# Patient Record
Sex: Male | Born: 1965 | Race: Black or African American | Hispanic: No | Marital: Married | Smoking: Current some day smoker
Health system: Southern US, Community
[De-identification: ages and names within clinical notes are randomized; demographics above are authoritative.]

## PROBLEM LIST (undated history)

## (undated) ENCOUNTER — Emergency Department (HOSPITAL_COMMUNITY): Admission: EM | Payer: Non-veteran care | Source: Home / Self Care

## (undated) DIAGNOSIS — D497 Neoplasm of unspecified behavior of endocrine glands and other parts of nervous system: Secondary | ICD-10-CM

## (undated) DIAGNOSIS — G4730 Sleep apnea, unspecified: Secondary | ICD-10-CM

## (undated) DIAGNOSIS — E039 Hypothyroidism, unspecified: Secondary | ICD-10-CM

## (undated) DIAGNOSIS — Z720 Tobacco use: Secondary | ICD-10-CM

## (undated) DIAGNOSIS — I10 Essential (primary) hypertension: Secondary | ICD-10-CM

## (undated) DIAGNOSIS — E785 Hyperlipidemia, unspecified: Secondary | ICD-10-CM

## (undated) HISTORY — DX: Sleep apnea, unspecified: G47.30

## (undated) HISTORY — DX: Neoplasm of unspecified behavior of endocrine glands and other parts of nervous system: D49.7

## (undated) HISTORY — PX: TONSILLECTOMY AND ADENOIDECTOMY: SUR1326

---

## 1997-12-18 ENCOUNTER — Emergency Department (HOSPITAL_COMMUNITY): Admission: EM | Admit: 1997-12-18 | Discharge: 1997-12-18 | Payer: Self-pay | Admitting: Internal Medicine

## 2006-08-26 ENCOUNTER — Ambulatory Visit (HOSPITAL_COMMUNITY): Admission: RE | Admit: 2006-08-26 | Discharge: 2006-08-26 | Payer: Self-pay | Admitting: Interventional Cardiology

## 2007-05-13 DIAGNOSIS — G4730 Sleep apnea, unspecified: Secondary | ICD-10-CM

## 2007-05-13 HISTORY — DX: Sleep apnea, unspecified: G47.30

## 2007-10-01 ENCOUNTER — Encounter: Payer: Self-pay | Admitting: Endocrinology

## 2007-10-25 ENCOUNTER — Encounter: Payer: Self-pay | Admitting: Endocrinology

## 2007-10-25 ENCOUNTER — Telehealth (INDEPENDENT_AMBULATORY_CARE_PROVIDER_SITE_OTHER): Payer: Self-pay | Admitting: *Deleted

## 2008-03-22 ENCOUNTER — Emergency Department (HOSPITAL_COMMUNITY): Admission: EM | Admit: 2008-03-22 | Discharge: 2008-03-22 | Payer: Self-pay | Admitting: Emergency Medicine

## 2008-05-12 DIAGNOSIS — D497 Neoplasm of unspecified behavior of endocrine glands and other parts of nervous system: Secondary | ICD-10-CM

## 2008-05-12 HISTORY — DX: Neoplasm of unspecified behavior of endocrine glands and other parts of nervous system: D49.7

## 2008-05-12 HISTORY — PX: THYROIDECTOMY: SHX17

## 2011-03-05 ENCOUNTER — Inpatient Hospital Stay (INDEPENDENT_AMBULATORY_CARE_PROVIDER_SITE_OTHER)
Admission: RE | Admit: 2011-03-05 | Discharge: 2011-03-05 | Disposition: A | Discharge status: Home / Self Care | Payer: Self-pay | Source: Ambulatory Visit | Attending: Family Medicine | Admitting: Family Medicine

## 2011-03-05 DIAGNOSIS — E039 Hypothyroidism, unspecified: Secondary | ICD-10-CM

## 2011-03-05 DIAGNOSIS — Z760 Encounter for issue of repeat prescription: Secondary | ICD-10-CM

## 2011-05-12 ENCOUNTER — Telehealth (HOSPITAL_COMMUNITY): Payer: Self-pay | Admitting: *Deleted

## 2011-05-15 ENCOUNTER — Emergency Department (INDEPENDENT_AMBULATORY_CARE_PROVIDER_SITE_OTHER)
Admission: EM | Admit: 2011-05-15 | Discharge: 2011-05-15 | Disposition: A | Discharge status: Home / Self Care | Payer: Medicaid Other | Source: Home / Self Care | Attending: Family Medicine | Admitting: Family Medicine

## 2011-05-15 DIAGNOSIS — E039 Hypothyroidism, unspecified: Secondary | ICD-10-CM

## 2011-05-15 DIAGNOSIS — M545 Low back pain: Secondary | ICD-10-CM

## 2011-05-15 MED ORDER — CYCLOBENZAPRINE HCL 10 MG PO TABS: 10.0000 mg | ORAL_TABLET | Freq: Two times a day (BID) | ORAL | Status: AC | PRN

## 2011-05-15 MED ORDER — IBUPROFEN 600 MG PO TABS: 600.0000 mg | ORAL_TABLET | Freq: Three times a day (TID) | ORAL | Status: AC | PRN

## 2011-05-15 MED ORDER — LEVOTHYROXINE SODIUM 150 MCG PO TABS: 150.0000 ug | ORAL_TABLET | Freq: Every day | ORAL | Status: DC

## 2011-05-15 NOTE — ED Notes (Signed)
Pt needs refill on thyroid medication- has been out since Monday.  States he was trying to keep something from falling at work on Monday and later that evening his lower back started aching.  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

## 2011-05-19 NOTE — ED Provider Notes (Signed)
History     CSN: [id number]  Arrival date & time 05/15/11  1750   First MD Initiated Contact with Patient 05/15/11 1821      Chief Complaint  Patient presents with  . Back Pain  . Medication Refill    (Consider location/radiation/quality/duration/timing/severity/associated sxs/prior treatment) HPI Comments: 46 y/o male h/o hypothyroidism here requesting refills for his thyroid medication as he ran out 3 days ago. He is establishing care with a primary care provider as he ws recently approved for medicaid and states his first appointment is not until next month. States he was started on synthroid while he was in jail and his TSH was monitored and has been stable on 150 mcg daily for a long time.  Also started with low back pain and muscle aches when he ran out of synthroid and also after trying to catch a box that was falling at work. He had refills before from ED providers. Denies fever or chills, denies hematuria or dysuria.   History reviewed. No pertinent past medical history.  Past Surgical History  Procedure Date  . Thyroidectomy     No family history on file.  History  Substance Use Topics  . Smoking status: Current Some Day Smoker -- 0.5 packs/day    Types: Cigarettes  . Smokeless tobacco: Not on file  . Alcohol Use: No      Review of Systems  Constitutional: Negative for fever, chills and fatigue.  Respiratory: Negative for cough, chest tightness and shortness of breath.   Cardiovascular: Negative for chest pain and leg swelling.  Gastrointestinal: Negative for nausea, vomiting and abdominal pain.  Musculoskeletal: Positive for back pain.  Neurological: Negative for dizziness and headaches.    Allergies  Review of patient's allergies indicates no known allergies.  Home Medications   Current Outpatient Rx  Name Route Sig Dispense Refill  . LEVOTHYROXINE SODIUM 150 MCG PO TABS Oral Take 150 mcg by mouth daily.      . CYCLOBENZAPRINE HCL 10 MG PO TABS Oral  Take 1 tablet (10 mg total) by mouth 2 (two) times daily as needed for muscle spasms. 20 tablet 0  . IBUPROFEN 600 MG PO TABS Oral Take 1 tablet (600 mg total) by mouth every 8 (eight) hours as needed for pain. 20 tablet 0    Take with food  . LEVOTHYROXINE SODIUM 150 MCG PO TABS Oral Take 1 tablet (150 mcg total) by mouth daily. 30 tablet 1    BP 129/64  Pulse 58  Temp(Src) 98 F (36.7 C) (Oral)  Resp 16  SpO2 97%  Physical Exam  Nursing note and vitals reviewed. Constitutional: He is oriented to person, place, and time. He appears well-developed and well-nourished. No distress.  HENT:  Head: Normocephalic.  Eyes: EOM are normal. Pupils are equal, round, and reactive to light.       No mixedema  Neck: Neck supple. No thyromegaly present.  Cardiovascular: Normal rate, regular rhythm, normal heart sounds and intact distal pulses.  Exam reveals no gallop and no friction rub.   No murmur heard.      Mild bradycardic  Pulmonary/Chest: Breath sounds normal.  Abdominal: Soft. There is no tenderness.  Musculoskeletal:       Lumbar back: He exhibits decreased range of motion and tenderness. He exhibits no bony tenderness, no swelling, no edema and no deformity.       Back:  Lymphadenopathy:    He has no cervical adenopathy.  Neurological: He is alert and oriented  to person, place, and time.  Skin: No rash noted.    ED Course  Procedures (including critical care time)  Labs Reviewed - No data to display No results found.   1. Hypothyroidism   2. Low back pain       MDM  Refilled synthroid, likely muscle aches related to hypothyroidism. Reccommended follow up in next 2-4 weeks. Red flags for return discussed.        Sharin Grave, MD 05/19/11 1059

## 2011-06-16 ENCOUNTER — Ambulatory Visit (INDEPENDENT_AMBULATORY_CARE_PROVIDER_SITE_OTHER): Payer: Medicaid Other | Admitting: Family Medicine

## 2011-06-16 ENCOUNTER — Encounter: Payer: Self-pay | Admitting: Family Medicine

## 2011-06-16 VITALS — BP 110/62 | HR 82 | Temp 98.0°F | Ht 73.0 in | Wt 291.0 lb

## 2011-06-16 DIAGNOSIS — E038 Other specified hypothyroidism: Secondary | ICD-10-CM

## 2011-06-16 DIAGNOSIS — E785 Hyperlipidemia, unspecified: Secondary | ICD-10-CM

## 2011-06-16 DIAGNOSIS — S39012A Strain of muscle, fascia and tendon of lower back, initial encounter: Secondary | ICD-10-CM

## 2011-06-16 DIAGNOSIS — E039 Hypothyroidism, unspecified: Secondary | ICD-10-CM

## 2011-06-16 DIAGNOSIS — E7800 Pure hypercholesterolemia, unspecified: Secondary | ICD-10-CM

## 2011-06-16 DIAGNOSIS — E669 Obesity, unspecified: Secondary | ICD-10-CM

## 2011-06-16 DIAGNOSIS — F17200 Nicotine dependence, unspecified, uncomplicated: Secondary | ICD-10-CM

## 2011-06-16 DIAGNOSIS — Z720 Tobacco use: Secondary | ICD-10-CM

## 2011-06-16 DIAGNOSIS — Z23 Encounter for immunization: Secondary | ICD-10-CM

## 2011-06-16 LAB — LDL CHOLESTEROL, DIRECT: Direct LDL: 179 mg/dL — ABNORMAL HIGH

## 2011-06-16 NOTE — Patient Instructions (Signed)
Dear Mr. Stohr,   Thank you for coming to clinic today. Please read below regarding the issues that we discussed.   1. Hypothyroidism - We will check your thyroid level today. If adjustments need to be made the medication, then I will let you know. Also, I will refill your medication when I have the results   2. Back Pain - This is likely a strain of your musles in your lower back. Also, it is worsened by being heaving in the abdomen.   - stretch for 10 minutes every morning and night by sitting on the floor and touching your toes, then bring your knees to your chest  - take up to four 400 mg ibuprofen daily; consider taking 2 in the morning to prevent pain  - place a heating pad on your back to reduce tightness of the muscles as needed  Follow-up in 2 months regarding back pain and weight loss.   Sincerely,   Dr. Clinton Sawyer

## 2011-06-17 ENCOUNTER — Encounter: Payer: Self-pay | Admitting: Family Medicine

## 2011-06-17 DIAGNOSIS — E669 Obesity, unspecified: Secondary | ICD-10-CM | POA: Insufficient documentation

## 2011-06-17 DIAGNOSIS — E038 Other specified hypothyroidism: Secondary | ICD-10-CM | POA: Insufficient documentation

## 2011-06-17 DIAGNOSIS — S39012A Strain of muscle, fascia and tendon of lower back, initial encounter: Secondary | ICD-10-CM | POA: Insufficient documentation

## 2011-06-17 DIAGNOSIS — E7800 Pure hypercholesterolemia, unspecified: Secondary | ICD-10-CM | POA: Insufficient documentation

## 2011-06-17 DIAGNOSIS — Z720 Tobacco use: Secondary | ICD-10-CM | POA: Insufficient documentation

## 2011-06-17 MED ORDER — IBUPROFEN 600 MG PO TABS: 600.0000 mg | ORAL_TABLET | Freq: Four times a day (QID) | ORAL | Status: AC | PRN

## 2011-06-17 MED ORDER — LEVOTHYROXINE SODIUM 200 MCG PO TABS: 200.0000 ug | ORAL_TABLET | Freq: Every day | ORAL | Status: DC

## 2011-06-17 NOTE — Assessment & Plan Note (Signed)
This is secondary to heavy lifting and will likely heal without problems as long as it is not aggravated. The three step treatment method is noted below.  1. Ibuprofen - 600 mg q 6 hrs PRN, take 2 in AM to prevent inflammation with work 2. Stretching - 10 minutes of hamstring and back stretches every morning and night; patient instructed on proper technique 3. Heat - if muscle feel tight, then place a heating pad to loosen;   He was encouraged to lift things in a safe manner with assistance to avoid further injury.

## 2011-06-17 NOTE — Assessment & Plan Note (Signed)
Today we will check Frank Nguyen's TSH. This is likely contributing to his fatigue. If it is elevated, then I will increase his levothyroxine.

## 2011-06-17 NOTE — Progress Notes (Signed)
  Subjective:    Patient ID: Frank Nguyen, male    DOB: [birth year], 46 y.o.   MRN: [id number]  Back Pain Pertinent negatives include no chest pain or numbness.   Frank Nguyen presents today for his initial visit at the clinic. We talked about the following issues below.   1. Hypothyroidism - has thyroid resected in 2009 2/2 hyperfunctioning nodules or tumors - at the time he claims he was always sweating and his heart raced; since the operation he has been taking Levothyroxine at 150 mcg daily; His TSH level was monitored at 3 months intervals while incarcerated; released from prison October 2012 and has not had a primary care visit since and no TSH checks; was without meds for 3 days in January, until Rx refilled at urgent care; notes increasing fatigue, falls asleep daily while watching television and notes fatigue in the afternoons; this is recent in onset and has not occurred before; he also notes dizziness when waking from sleep and going from lying to standing; denies falls or hitting his head; it is temporary and spontaneously resolves within 1 minute  2. 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  Respiratory: Negative for chest tightness and shortness of breath.   Cardiovascular: Negative for chest pain.  Musculoskeletal: Positive for myalgias and back pain.  Neurological: Positive for dizziness and light-headedness. Negative for tremors, facial asymmetry, speech difficulty and numbness.  Psychiatric/Behavioral:  Negative for behavioral problems and dysphoric mood.       Objective:   Physical Exam BP 110/62  Pulse 82  Temp(Src) 98 F (36.7 C) (Oral)  Ht 6\' 1"  (1.854 m)  Wt 291 lb (131.997 kg)  BMI 38.39 kg/m2 Gen: pleasant, talkative, alert, oriented, NAD, obese body habitus HEENT: poor dentition, NCAT, PERRLA; thyroid not palpable, 3 cm transverse well-healed incision scar above sternal notch Cardiac: RRR, no M,R,G Lungs: CTA - B Back - non-tender to palpation along spine and post superior iliac crests; no deformity;  MSK: 5/5 strength of lower extremities bilaterally Neuro: sensation in tact to extremities        Assessment & Plan:  Frank Nguyen is a 46 year old gentleman with hypothyroid disease that has been poorly monitored and new onset lower back pain that likely represents a lumbosacral strain.

## 2011-06-17 NOTE — Assessment & Plan Note (Signed)
Frank Nguyen is concerned about his weight and it's contribution to his back pain. We did not have time to address his weight at this visit, but will do so in the future. Also, today will check Direct LDL for screening purposes. Treat accordingly.

## 2011-06-30 ENCOUNTER — Ambulatory Visit (INDEPENDENT_AMBULATORY_CARE_PROVIDER_SITE_OTHER): Payer: Medicaid Other | Admitting: Family Medicine

## 2011-06-30 ENCOUNTER — Encounter: Payer: Self-pay | Admitting: Family Medicine

## 2011-06-30 VITALS — BP 135/82 | HR 84 | Ht 73.0 in | Wt 294.0 lb

## 2011-06-30 DIAGNOSIS — R200 Anesthesia of skin: Secondary | ICD-10-CM | POA: Insufficient documentation

## 2011-06-30 DIAGNOSIS — R209 Unspecified disturbances of skin sensation: Secondary | ICD-10-CM

## 2011-06-30 NOTE — Progress Notes (Signed)
  Subjective:    Patient ID: Frank Nguyen, male    DOB: 07-07-1965, 46 y.o.   MRN: [id number]  HPI 1. Left foot numbness: Here with complaint of numbness in his lateral forefoot and the fifth toe of left foot. He is concerned that this may be a circulation problem. He denies any injury to this area. Numbness has been going on for approximately 1-1/2 weeks. Was seen fairly recently for back pain this is resolved. He denies any pain or numbness down the leg. Does wear steel toed shoes at work but these have never given him a problem in the past. He has recently purchased a new pair of boots.   Review of Systems     Objective:   Physical Exam Generally: Well-appearing, well-nourished no acute distress Extremities: Left foot; dorsalis pedis pulse 2+ posterior tibial pulse 2+. Foot is warm and well-perfused. He does have sensation to firm pressure along the fifth toe. He does have diminished sensation to light touch along the fifth toe. Sensation is intact along the other toes and the rest of the foot.       Assessment & Plan:

## 2011-06-30 NOTE — Assessment & Plan Note (Signed)
Numbness of toes likely secondary to compression from new shoes. I told him this is likely to resolve once shoes are broken. I reassured him that the circulation is good and this does not seem to be related to a nerve higher up. Advised to followup with Dr. Clinton Sawyer if not improving.

## 2011-07-07 ENCOUNTER — Ambulatory Visit: Payer: Medicaid Other | Admitting: Family Medicine

## 2011-07-25 ENCOUNTER — Ambulatory Visit (INDEPENDENT_AMBULATORY_CARE_PROVIDER_SITE_OTHER): Payer: Medicaid Other | Admitting: Family Medicine

## 2011-07-25 VITALS — BP 118/78 | HR 112 | Temp 98.3°F | Ht 73.0 in | Wt 291.0 lb

## 2011-07-25 DIAGNOSIS — R209 Unspecified disturbances of skin sensation: Secondary | ICD-10-CM

## 2011-07-25 DIAGNOSIS — E7800 Pure hypercholesterolemia, unspecified: Secondary | ICD-10-CM

## 2011-07-25 DIAGNOSIS — E038 Other specified hypothyroidism: Secondary | ICD-10-CM

## 2011-07-25 DIAGNOSIS — R200 Anesthesia of skin: Secondary | ICD-10-CM

## 2011-07-25 DIAGNOSIS — E039 Hypothyroidism, unspecified: Secondary | ICD-10-CM

## 2011-07-25 MED ORDER — SIMVASTATIN 20 MG PO TABS: 20.0000 mg | ORAL_TABLET | Freq: Every day | ORAL | Status: DC

## 2011-07-25 MED ORDER — LEVOTHYROXINE SODIUM 200 MCG PO TABS: 200.0000 ug | ORAL_TABLET | Freq: Every day | ORAL | Status: DC

## 2011-07-25 NOTE — Patient Instructions (Signed)
Dear Frank Nguyen,   Thank you for coming to clinic today. Please read below regarding the issues that we discussed.   1. Numbness in toe - I think this is due to compression of a nerve in the skin. I would not recommend any more work-up right now, but just watch and see if it heals over the next few months.   2.Cholesterol - I will start you on a medication called Simvastatin. Then we will check your cholesterol in 6 months.   3. Testosterone - Lets consider checking this at your next visit.   Please follow up in clinic in 5 weeks. Come to the clinic one week earlier to have labs drawn . Please call earlier if you have any questions or concerns.   Sincerely,   Dr. Clinton Sawyer    Cholesterol Control Diet Cholesterol levels in your body are determined significantly by your diet. Cholesterol levels may also be related to heart disease. The following material helps to explain this relationship and discusses what you can do to help keep your heart healthy. Not all cholesterol is bad. Low-density lipoprotein (LDL) cholesterol is the "bad" cholesterol. It may cause fatty deposits to build up inside your arteries. High-density lipoprotein (HDL) cholesterol is "good." It helps to remove the "bad" LDL cholesterol from your blood. Cholesterol is a very important risk factor for heart disease. Other risk factors are high blood pressure, smoking, stress, heredity, and weight. The heart muscle gets its supply of blood through the coronary arteries. If your LDL cholesterol is high and your HDL cholesterol is low, you are at risk for having fatty deposits build up in your coronary arteries. This leaves less room through which blood can flow. Without sufficient blood and oxygen, the heart muscle cannot function properly and you may feel chest pains (angina pectoris). When a coronary artery closes up entirely, a part of the heart muscle may die, causing a heart attack (myocardial infarction). CHECKING  CHOLESTEROL When your caregiver sends your blood to a lab to be analyzed for cholesterol, a complete lipid (fat) profile may be done. With this test, the total amount of cholesterol and levels of LDL and HDL are determined. Triglycerides are a type of fat that circulates in the blood and can also be used to determine heart disease risk. The list below describes what the numbers should be: Test: Total Cholesterol.  Less than 200 mg/dl.  Test: LDL "bad cholesterol."  Less than 100 mg/dl.   Less than 70 mg/dl if you are at very high risk of a heart attack or sudden cardiac death.  Test: HDL "good cholesterol."  Greater than 50 mg/dl for women.   Greater than 40 mg/dl for men.  Test: Triglycerides.  Less than 150 mg/dl.  CONTROLLING CHOLESTEROL WITH DIET Although exercise and lifestyle factors are important, your diet is key. That is because certain foods are known to raise cholesterol and others to lower it. The goal is to balance foods for their effect on cholesterol and more importantly, to replace saturated and trans fat with other types of fat, such as monounsaturated fat, polyunsaturated fat, and omega-3 fatty acids. On average, a person should consume no more than 15 to 17 g of saturated fat daily. Saturated and trans fats are considered "bad" fats, and they will raise LDL cholesterol. Saturated fats are primarily found in animal products such as meats, butter, and cream. However, that does not mean you need to sacrifice all your favorite foods. Today, there are good tasting,  low-fat, low-cholesterol substitutes for most of the things you like to eat. Choose low-fat or nonfat alternatives. Choose round or loin cuts of red meat, since these types of cuts are lowest in fat and cholesterol. Chicken (without the skin), fish, veal, and ground [place] breast are excellent choices. Eliminate fatty meats, such as hot dogs and salami. Even shellfish have little or no saturated fat. Have a 3 oz (85 g)  portion when you eat lean meat, poultry, or fish. Trans fats are also called "partially hydrogenated oils." They are oils that have been scientifically manipulated so that they are solid at room temperature resulting in a longer shelf life and improved taste and texture of foods in which they are added. Trans fats are found in stick margarine, some tub margarines, cookies, crackers, and baked goods.  When baking and cooking, oils are an excellent substitute for butter. The monounsaturated oils are especially beneficial since it is believed they lower LDL and raise HDL. The oils you should avoid entirely are saturated tropical oils, such as coconut and palm.  Remember to eat liberally from food groups that are naturally free of saturated and trans fat, including fish, fruit, vegetables, beans, grains (barley, rice, couscous, bulgur wheat), and pasta (without cream sauces).  IDENTIFYING FOODS THAT LOWER CHOLESTEROL  Soluble fiber may lower your cholesterol. This type of fiber is found in fruits such as apples, vegetables such as broccoli, potatoes, and carrots, legumes such as beans, peas, and lentils, and grains such as barley. Foods fortified with plant sterols (phytosterol) may also lower cholesterol. You should eat at least 2 g per day of these foods for a cholesterol lowering effect.  Read package labels to identify low-saturated fats, trans fats free, and low-fat foods at the supermarket. Select cheeses that have only 2 to 3 g saturated fat per ounce. Use a heart-healthy tub margarine that is free of trans fats or partially hydrogenated oil. When buying baked goods (cookies, crackers), avoid partially hydrogenated oils. Breads and muffins should be made from whole grains (whole-wheat or whole oat flour, instead of "flour" or "enriched flour"). Buy non-creamy canned soups with reduced salt and no added fats.  FOOD PREPARATION TECHNIQUES  Never deep-fry. If you must fry, either stir-fry, which uses very  little fat, or use non-stick cooking sprays. When possible, broil, bake, or roast meats, and steam vegetables. Instead of dressing vegetables with butter or margarine, use lemon and herbs, applesauce and cinnamon (for squash and sweet potatoes), nonfat yogurt, salsa, and low-fat dressings for salads.  LOW-SATURATED FAT / LOW-FAT FOOD SUBSTITUTES Meats / Saturated Fat (g)  Avoid: Steak, marbled (3 oz/85 g) / 11 g   Choose: Steak, lean (3 oz/85 g) / 4 g   Avoid: Hamburger (3 oz/85 g) / 7 g   Choose: Hamburger, lean (3 oz/85 g) / 5 g   Avoid: Ham (3 oz/85 g) / 6 g   Choose: Ham, lean cut (3 oz/85 g) / 2.4 g   Avoid: Chicken, with skin, dark meat (3 oz/85 g) / 4 g   Choose: Chicken, skin removed, dark meat (3 oz/85 g) / 2 g   Avoid: Chicken, with skin, light meat (3 oz/85 g) / 2.5 g   Choose: Chicken, skin removed, light meat (3 oz/85 g) / 1 g  Dairy / Saturated Fat (g)  Avoid: Whole milk (1 cup) / 5 g   Choose: Low-fat milk, 2% (1 cup) / 3 g   Choose: Low-fat milk, 1% (1 cup) /  1.5 g   Choose: Skim milk (1 cup) / 0.3 g   Avoid: Hard cheese (1 oz/28 g) / 6 g   Choose: Skim milk cheese (1 oz/28 g) / 2 to 3 g   Avoid: Cottage cheese, 4% fat (1 cup) / 6.5 g   Choose: Low-fat cottage cheese, 1% fat (1 cup) / 1.5 g   Avoid: Ice cream (1 cup) / 9 g   Choose: Sherbet (1 cup) / 2.5 g   Choose: Nonfat frozen yogurt (1 cup) / 0.3 g   Choose: Frozen fruit bar / trace   Avoid: Whipped cream (1 tbs) / 3.5 g   Choose: Nondairy whipped topping (1 tbs) / 1 g  Condiments / Saturated Fat (g)  Avoid: Mayonnaise (1 tbs) / 2 g   Choose: Low-fat mayonnaise (1 tbs) / 1 g   Avoid: Butter (1 tbs) / 7 g   Choose: Extra light margarine (1 tbs) / 1 g   Avoid: Coconut oil (1 tbs) / 11.8 g   Choose: Olive oil (1 tbs) / 1.8 g   Choose: Corn oil (1 tbs) / 1.7 g   Choose: Safflower oil (1 tbs) / 1.2 g   Choose: Sunflower oil (1 tbs) / 1.4 g   Choose: Soybean oil (1 tbs) / 2.4 g    Choose: Canola oil (1 tbs) / 1 g  Document Released: 04/28/2005 Document Revised: 04/17/2011 Document Reviewed: 10/17/2010 [facility] Patient Information 2012 [place], [place].

## 2011-08-01 ENCOUNTER — Ambulatory Visit: Payer: Medicaid Other | Admitting: Family Medicine

## 2011-08-14 NOTE — Assessment & Plan Note (Addendum)
Patient started on simvastatin 20 mg. Also given diet information for low cholesterol diet. I cannot calculate his 10 year risk of CHD b/c I don't have his tot. Cholesterol or HDL. However, his initial goal of treatment will be a goal of < 130.  Reassess 6-12 months after starting medication.

## 2011-08-14 NOTE — Progress Notes (Signed)
  Subjective:    Patient ID: Frank Nguyen, male    DOB: 1965/10/05, 46 y.o.   MRN: [id number]  HPI Frank Nguyen presents today with the following concerns: toe numbness and decreased libido. I suggested that we discuss his cholesterol as well and address the libido issue at the next visit, since it was less concerning.   1. Numbness in his left little toe: This has been persistent for several weeks. He recently saw Dr. Ashley Royalty for the problem who felt that is was the result of wearing new work boots that were compressing a cutaneous nerve. Frank Nguyen concerns is that this could represent a serious health issue or that he could injure his toe and not feel it.  The decreased sensation is limited to the 5th digit on the left foot. There is no tingling or burning. He denies any new shoes or previous injuries or surgeries to the area. He would like to know it can be treated.   2. Hyperlipidemia:    Lab Results  Component Value Date   LDLDIRECT 179* 06/16/2011   Frank Nguyen has never been treated for his high cholesterol. His risk factors for heart disease included obesity, hypothyroidism and elevated LDL. He is interested in prevention of CAD and would like to treat his cholesterol with det and medicine.   Review of Systems Positive for fatigue, decrease libido Negative for CP, SOB, numbness of the face, facial droop, headache, erectile dysfunction    Objective:   Physical Exam BP 118/78  Pulse 112  Temp(Src) 98.3 F (36.8 C) (Oral)  Ht 6\' 1"  (1.854 m)  Wt 291 lb (131.997 kg)  BMI 38.39 kg/m2 Gen: alert, pleasant, very talkative Cardiac: RRR, no carotid bruits, no renal bruits Feet: no bony deformities, no ulcers, normal ROM of ankles and CMP and IP joints bilaterally, 2+ DP pulses bilaterally, decreased sensation to lateral and dorsal surfaces of left small toe with pin prick, sensation intact to plantar and medial surfaces with pin prick      Assessment & Plan:  Mr.  Nguyen is a 46 year old gentleman with unresolved numbness of his left 5th toe and hyperlipidemia that requires intervention.

## 2011-08-14 NOTE — Assessment & Plan Note (Signed)
No evidence of neurovascular injury to the toe. It is like the compression of a cutaneous nerve that will heal in several months. If it does not heal completely, then I see no long term detriment to the patient's health. He agrees to expectant management.

## 2011-09-24 ENCOUNTER — Ambulatory Visit (INDEPENDENT_AMBULATORY_CARE_PROVIDER_SITE_OTHER): Payer: Medicaid Other | Admitting: Family Medicine

## 2011-09-24 ENCOUNTER — Ambulatory Visit: Payer: Medicaid Other

## 2011-09-24 ENCOUNTER — Encounter: Payer: Self-pay | Admitting: Family Medicine

## 2011-09-24 VITALS — BP 134/69 | HR 58 | Temp 97.2°F | Ht 72.0 in | Wt 290.0 lb

## 2011-09-24 DIAGNOSIS — R040 Epistaxis: Secondary | ICD-10-CM | POA: Insufficient documentation

## 2011-09-24 NOTE — Assessment & Plan Note (Signed)
Left anterior nosebleed, self resolved.  Not likely, but possible due to new irritant.  discussed using Vaseline, avoiding blowing nose and aggressive nose washing.  Given red flags for return.

## 2011-09-24 NOTE — Progress Notes (Signed)
  Subjective:    Patient ID: Frank Nguyen, male    DOB: 09-05-1965, 46 y.o.   MRN: [id number]  HPI46 yo here for work in appt for nosebleeds  For past 4 days, has had two episodes of nosebleeds on non consecutive days.  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  No itching or pain.  Nosebleeds quickly revolved with pressure.    I have reviewed patient's  PMH, FH, and Social history and Medications as related to this visit. No history of trouble with nosebleeds, no bleeding disorder.    Review of Systemssee HPI     Objective:   Physical Exam  GEN: NAD Nose:  Left nare with small anterior septal scab.  No active bleeding.      Assessment & Plan:

## 2011-09-24 NOTE — Patient Instructions (Signed)

## 2011-10-24 ENCOUNTER — Telehealth: Payer: Self-pay | Admitting: Family Medicine

## 2011-10-24 DIAGNOSIS — E038 Other specified hypothyroidism: Secondary | ICD-10-CM

## 2011-10-24 DIAGNOSIS — E039 Hypothyroidism, unspecified: Secondary | ICD-10-CM

## 2011-10-24 NOTE — Telephone Encounter (Signed)
Patient is out of refills on Synthroid.  The pharmacy sent the request a few days ago and they have not heard back.  They use [facility] on [address].  He has none left.

## 2011-10-27 MED ORDER — LEVOTHYROXINE SODIUM 200 MCG PO TABS: 200.0000 ug | ORAL_TABLET | Freq: Every day | ORAL | Status: DC

## 2011-10-27 NOTE — Telephone Encounter (Signed)
I have refilled the patient's medication. Please call him to let him know. Thank you.

## 2011-12-10 ENCOUNTER — Other Ambulatory Visit: Payer: Medicaid Other

## 2011-12-10 DIAGNOSIS — E038 Other specified hypothyroidism: Secondary | ICD-10-CM

## 2011-12-10 LAB — TSH: TSH: 0.509 u[IU]/mL (ref 0.350–4.500)

## 2011-12-10 NOTE — Progress Notes (Signed)
TSH DONE TODAY Pablo Stauffer 

## 2011-12-17 ENCOUNTER — Ambulatory Visit: Payer: Medicaid Other | Admitting: Family Medicine

## 2011-12-24 ENCOUNTER — Encounter: Payer: Self-pay | Admitting: Family Medicine

## 2012-01-13 ENCOUNTER — Ambulatory Visit: Payer: Medicaid Other | Admitting: Family Medicine

## 2012-01-28 ENCOUNTER — Encounter: Payer: Self-pay | Admitting: Family Medicine

## 2012-01-28 ENCOUNTER — Ambulatory Visit (INDEPENDENT_AMBULATORY_CARE_PROVIDER_SITE_OTHER): Payer: Medicaid Other | Admitting: Family Medicine

## 2012-01-28 VITALS — BP 117/76 | HR 78 | Temp 98.0°F | Ht 71.0 in | Wt 292.2 lb

## 2012-01-28 DIAGNOSIS — G479 Sleep disorder, unspecified: Secondary | ICD-10-CM

## 2012-01-28 DIAGNOSIS — M722 Plantar fascial fibromatosis: Secondary | ICD-10-CM | POA: Insufficient documentation

## 2012-01-28 DIAGNOSIS — E7800 Pure hypercholesterolemia, unspecified: Secondary | ICD-10-CM

## 2012-01-28 DIAGNOSIS — E039 Hypothyroidism, unspecified: Secondary | ICD-10-CM

## 2012-01-28 DIAGNOSIS — E038 Other specified hypothyroidism: Secondary | ICD-10-CM

## 2012-01-28 MED ORDER — LEVOTHYROXINE SODIUM 200 MCG PO TABS: 200.0000 ug | ORAL_TABLET | Freq: Every day | ORAL | Status: DC

## 2012-01-28 NOTE — Assessment & Plan Note (Signed)
He is at risk for OSA given his body habitus and his history is suggestive of this problem. Therefore, I will refer him to [facility] for a polysomnogram.

## 2012-01-28 NOTE — Progress Notes (Signed)
  Subjective:    Patient ID: Frank Nguyen, male    DOB: 10-Dec-1965, 46 y.o.   MRN: [id number]  HPI  46 year old male with secondary hypothyroidism, hyperlipidemia, and obesity presenting for routine follow up.   1. Hypothyroidism - Taking daily synthroid (200 mcg) without missing doses. His last TSH was 0.509 in June. Since that time he denies any changes to the way that he feels including excess sleepiness, unexpected weight gain, skin or hair changes, or changes to his mood.   2. 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 Margart Sickles inserts  3. Sleeping Concerns - The patient is concerned that he has daytime sleepiness. He reports sleeping poorly at night, and his wife believes that he stops breathing occasionally during his sleep. Additionally, he snores.   4. Hyperlipidemia - Patient denies taking his crestor except occasionally. He notes that he should be taking it, but wasn't concerned about making it a priority. He denies any side effects when he takes the medication. He is willing to start taking it daily and willing to have his lipid panel drawn in the future when his regimen improves. He denies chest pain, SOB, and unilateral weakness.    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 M with untreated hyperlipidemia, heel pain likely representing mild plantar  fasciitis, and concern for obstructive sleep apnea.

## 2012-01-28 NOTE — Assessment & Plan Note (Signed)
Patient not taking Crestor. Will take for 2 months and then check fasting lipid panel.

## 2012-01-28 NOTE — Assessment & Plan Note (Signed)
This is mild per his history and physical. He was given stretches to try, icing regimen and encouraged to always wear heel pads. Reassess at follow up visit.

## 2012-01-28 NOTE — Assessment & Plan Note (Signed)
Continue synthroid at 200 mcg daily.

## 2012-01-28 NOTE — Patient Instructions (Signed)
Plantar Fasciitis (Heel Spur Syndrome) with Rehab The plantar fascia is a fibrous, ligament-like, soft-tissue structure that spans the bottom of the foot. Plantar fasciitis is a condition that causes pain in the foot due to inflammation of the tissue. SYMPTOMS   Pain and tenderness on the underneath side of the foot.   Pain that worsens with standing or walking.  CAUSES  Plantar fasciitis is caused by irritation and injury to the plantar fascia on the underneath side of the foot. Common mechanisms of injury include:  Direct trauma to bottom of the foot.   Damage to a small nerve that runs under the foot where the main fascia attaches to the heel bone.   Stress placed on the plantar fascia due to bone spurs.  RISK INCREASES WITH:   Activities that place stress on the plantar fascia (running, jumping, pivoting, or cutting).   Poor strength and flexibility.   Improperly fitted shoes.   Tight calf muscles.   Flat feet.   Failure to warm-up properly before activity.   Obesity.  PREVENTION  Warm up and stretch properly before activity.   Allow for adequate recovery between workouts.   Maintain physical fitness:   Strength, flexibility, and endurance.   Cardiovascular fitness.   Maintain a health body weight.   Avoid stress on the plantar fascia.   Wear properly fitted shoes, including arch supports for individuals who have flat feet.  PROGNOSIS  If treated properly, then the symptoms of plantar fasciitis usually resolve without surgery. However, occasionally surgery is necessary. RELATED COMPLICATIONS   Recurrent symptoms that may result in a chronic condition.   Problems of the lower back that are caused by compensating for the injury, such as limping.   Pain or weakness of the foot during push-off following surgery.   Chronic inflammation, scarring, and partial or complete fascia tear, occurring more often from repeated injections.  TREATMENT  Treatment  initially involves the use of ice and medication to help reduce pain and inflammation. The use of strengthening and stretching exercises may help reduce pain with activity, especially stretches of the Achilles tendon. These exercises may be performed at home or with a therapist. Your caregiver may recommend that you use heel cups of arch supports to help reduce stress on the plantar fascia. Occasionally, corticosteroid injections are given to reduce inflammation. If symptoms persist for greater than 6 months despite non-surgical (conservative), then surgery may be recommended.  MEDICATION   If pain medication is necessary, then nonsteroidal anti-inflammatory medications, such as aspirin and ibuprofen, or other minor pain relievers, such as acetaminophen, are often recommended.   Do not take pain medication within 7 days before surgery.   Prescription pain relievers may be given if deemed necessary by your caregiver. Use only as directed and only as much as you need.   Corticosteroid injections may be given by your caregiver. These injections should be reserved for the most serious cases, because they may only be given a certain number of times.  HEAT AND COLD  Cold treatment (icing) relieves pain and reduces inflammation. Cold treatment should be applied for 10 to 15 minutes every 2 to 3 hours for inflammation and pain and immediately after any activity that aggravates your symptoms. Use ice packs or massage the area with a piece of ice (ice massage).   Heat treatment may be used prior to performing the stretching and strengthening activities prescribed by your caregiver, physical therapist, or athletic trainer. Use a heat pack or soak the  injury in warm water.  SEEK IMMEDIATE MEDICAL CARE IF:  Treatment seems to offer no benefit, or the condition worsens.   Any medications produce adverse side effects.  EXERCISES RANGE OF MOTION (ROM) AND STRETCHING EXERCISES - Plantar Fasciitis (Heel Spur  Syndrome) These exercises may help you when beginning to rehabilitate your injury. Your symptoms may resolve with or without further involvement from your physician, physical therapist or athletic trainer. While completing these exercises, remember:   Restoring tissue flexibility helps normal motion to return to the joints. This allows healthier, less painful movement and activity.   An effective stretch should be held for at least 30 seconds.   A stretch should never be painful. You should only feel a gentle lengthening or release in the stretched tissue.  RANGE OF MOTION - Toe Extension, Flexion  Sit with your right / left leg crossed over your opposite knee.   Grasp your toes and gently pull them back toward the top of your foot. You should feel a stretch on the bottom of your toes and/or foot.   Hold this stretch for _____15-20_____ seconds.   Now, gently pull your toes toward the bottom of your foot. You should feel a stretch on the top of your toes and or foot.   Hold this stretch for __________ seconds.  Repeat ____3______ times. Complete this stretch ____3______ times per day.  RANGE OF MOTION - Ankle Dorsiflexion, Active Assisted  Remove shoes and sit on a chair that is preferably not on a carpeted surface.   Place right / left foot under knee. Extend your opposite leg for support.   Keeping your heel down, slide your right / left foot back toward the chair until you feel a stretch at your ankle or calf. If you do not feel a stretch, slide your bottom forward to the edge of the chair, while still keeping your heel down.   Hold this stretch for _____15-20_____ seconds.  Repeat ____3______ times. Complete this stretch ______3____ times per day.  STRETCH - Gastroc, Standing  Place hands on wall.   Extend right / left leg, keeping the front knee somewhat bent.   Slightly point your toes inward on your back foot.   Keeping your right / left heel on the floor and your knee  straight, shift your weight toward the wall, not allowing your back to arch.   You should feel a gentle stretch in the right / left calf. Hold this position for __________ seconds.  Repeat __________ times. Complete this stretch __________ times per day. STRETCH - Soleus, Standing  Place hands on wall.   Extend right / left leg, keeping the other knee somewhat bent.   Slightly point your toes inward on your back foot.   Keep your right / left heel on the floor, bend your back knee, and slightly shift your weight over the back leg so that you feel a gentle stretch deep in your back calf.   Hold this position for __________ seconds.  Repeat __________ times. Complete this stretch __________ times per day. STRETCH - Gastrocsoleus, Standing  Note: This exercise can place a lot of stress on your foot and ankle. Please complete this exercise only if specifically instructed by your caregiver.   Place the ball of your right / left foot on a step, keeping your other foot firmly on the same step.   Hold on to the wall or a rail for balance.   Slowly lift your other foot, allowing your  body weight to press your heel down over the edge of the step.   You should feel a stretch in your right / left calf.   Hold this position for __________ seconds.   Repeat this exercise with a slight bend in your right / left knee.  Repeat __________ times. Complete this stretch __________ times per day.  STRENGTHENING EXERCISES - Plantar Fasciitis (Heel Spur Syndrome)  These exercises may help you when beginning to rehabilitate your injury. They may resolve your symptoms with or without further involvement from your physician, physical therapist or athletic trainer. While completing these exercises, remember:   Muscles can gain both the endurance and the strength needed for everyday activities through controlled exercises.   Complete these exercises as instructed by your physician, physical therapist or  athletic trainer. Progress the resistance and repetitions only as guided.  STRENGTH - Towel Curls  Sit in a chair positioned on a non-carpeted surface.   Place your foot on a towel, keeping your heel on the floor.   Pull the towel toward your heel by only curling your toes. Keep your heel on the floor.   If instructed by your physician, physical therapist or athletic trainer, add ____________________ at the end of the towel.  Repeat __________ times. Complete this exercise __________ times per day. STRENGTH - Ankle Inversion  Secure one end of a rubber exercise band/tubing to a fixed object (table, pole). Loop the other end around your foot just before your toes.   Place your fists between your knees. This will focus your strengthening at your ankle.   Slowly, pull your big toe up and in, making sure the band/tubing is positioned to resist the entire motion.   Hold this position for __________ seconds.   Have your muscles resist the band/tubing as it slowly pulls your foot back to the starting position.  Repeat __________ times. Complete this exercises __________ times per day.  Document Released: 04/28/2005 Document Revised: 04/17/2011 Document Reviewed: 08/10/2008 [facility] Patient Information 2012 [place], [place].

## 2012-01-29 NOTE — Addendum Note (Signed)
Addended by: Damita Lack on: 01/29/2012 08:36 AM   Modules accepted: Orders

## 2012-02-23 ENCOUNTER — Ambulatory Visit (HOSPITAL_BASED_OUTPATIENT_CLINIC_OR_DEPARTMENT_OTHER): Payer: Medicaid Other

## 2012-02-24 ENCOUNTER — Encounter: Payer: Self-pay | Admitting: Family Medicine

## 2012-06-28 ENCOUNTER — Telehealth: Payer: Self-pay | Admitting: Family Medicine

## 2012-06-28 DIAGNOSIS — E785 Hyperlipidemia, unspecified: Secondary | ICD-10-CM

## 2012-06-28 DIAGNOSIS — E039 Hypothyroidism, unspecified: Secondary | ICD-10-CM

## 2012-06-28 NOTE — Telephone Encounter (Signed)
Forward to PCP for lab orders

## 2012-06-28 NOTE — Telephone Encounter (Signed)
Patient is scheduled for a physical on 3/7 and wants to have labs drawn on 2/24.  He is planning on coming in for fasting labs and what ever else Dr. Clinton Sawyer feels is necessary and orders need to be entered for that appt.

## 2012-06-29 NOTE — Telephone Encounter (Addendum)
Orders for lipid profile and TSH placed. Please let patient know if needed.

## 2012-06-29 NOTE — Addendum Note (Signed)
Addended by: Garnetta Buddy on: 06/29/2012 01:33 AM   Modules accepted: Orders

## 2012-07-05 ENCOUNTER — Other Ambulatory Visit: Payer: Medicaid Other

## 2012-07-05 DIAGNOSIS — E785 Hyperlipidemia, unspecified: Secondary | ICD-10-CM

## 2012-07-05 DIAGNOSIS — E039 Hypothyroidism, unspecified: Secondary | ICD-10-CM

## 2012-07-05 NOTE — Progress Notes (Signed)
FLP AND TSH DONE TODAY Frank Nguyen 

## 2012-07-06 LAB — LIPID PANEL
Cholesterol: 186 mg/dL (ref 0–200)
Total CHOL/HDL Ratio: 4.2 Ratio

## 2012-07-16 ENCOUNTER — Encounter: Payer: Medicaid Other | Admitting: Family Medicine

## 2012-07-23 ENCOUNTER — Ambulatory Visit (INDEPENDENT_AMBULATORY_CARE_PROVIDER_SITE_OTHER): Payer: Medicaid Other | Admitting: Family Medicine

## 2012-07-23 ENCOUNTER — Encounter: Payer: Self-pay | Admitting: Family Medicine

## 2012-07-23 VITALS — BP 137/82 | HR 82 | Ht 72.0 in | Wt 313.0 lb

## 2012-07-23 DIAGNOSIS — M545 Low back pain: Secondary | ICD-10-CM

## 2012-07-23 MED ORDER — IBUPROFEN 600 MG PO TABS: 600.0000 mg | ORAL_TABLET | Freq: Three times a day (TID) | ORAL | Status: DC | PRN

## 2012-07-23 MED ORDER — CYCLOBENZAPRINE HCL 10 MG PO TABS: 10.0000 mg | ORAL_TABLET | Freq: Three times a day (TID) | ORAL | Status: DC | PRN

## 2012-07-23 NOTE — Patient Instructions (Addendum)
Back Exercises Back exercises help treat and prevent back injuries. The goal is to increase your strength in your belly (abdominal) and back muscles. These exercises can also help with flexibility. Start these exercises when told by your doctor. HOME CARE Back exercises include: Pelvic Tilt.  Lie on your back with your knees bent. Tilt your pelvis until the lower part of your back is against the floor. Hold this position 5 to 10 sec. Repeat this exercise 5 to 10 times. Knee to Chest.  Pull 1 knee up against your chest and hold for 20 to 30 seconds. Repeat this with the other knee. This may be done with the other leg straight or bent, whichever feels better. Then, pull both knees up against your chest. Sit-Ups or Curl-Ups.  Bend your knees 90 degrees. Start with tilting your pelvis, and do a partial, slow sit-up. Only lift your upper half 30 to 45 degrees off the floor. Take at least 2 to 3 seonds for each sit-up. Do not do sit-ups with your knees out straight. If partial sit-ups are difficult, simply do the above but with only tightening your belly (abdominal) muscles and holding it as told. Hip-Lift.  Lie on your back with your knees flexed 90 degrees. Push down with your feet and shoulders as you raise your hips 2 inches off the floor. Hold for 10 seconds, repeat 5 to 10 times. Back Arches.  Lie on your stomach. Prop yourself up on bent elbows. Slowly press on your hands, causing an arch in your low back. Repeat 3 to 5 times. Shoulder-Lifts.  Lie face down with arms beside your body. Keep hips and belly pressed to floor as you slowly lift your head and shoulders off the floor. Do not overdo your exercises. Be careful in the beginning. Exercises may cause you some mild back discomfort. If the pain lasts for more than 15 minutes, stop the exercises until you see your doctor. Improvement with exercise for back problems is slow.  Document Released: 05/31/2010 Document Revised: 07/21/2011  Document Reviewed: 02/27/2011 [employer] Patient Information 2013 [employer].  

## 2012-07-25 NOTE — Progress Notes (Signed)
  Subjective:    Patient ID: Frank Nguyen, male    DOB: [birth year], 47 y.o.   MRN: [id number]  HPI  1. Low back pain:  C/o low back pain x2 weeks.  Has put on some extra weight over the past few months and has had increasing back pain.  He has started going to the gym for weight loss but noticed his pain gets worse after working out.  Describes pain as tightness without radiation into legs.  He is currently using extra strength tylenol.  Denies bowel or bladder incontinence.  Review of Systems Per HPI    Objective:   Physical Exam  Constitutional: He appears well-nourished. No distress.  Musculoskeletal:  Back with full rom.  Mild tenderness with tightness of paraspinal musculature.  No bony tenderness or step off palpated.           Assessment & Plan:

## 2012-07-25 NOTE — Assessment & Plan Note (Signed)
Symptomatic treatment and weight loss suggested.  Can continue NSAIDS and added on muscle relaxer.  No red flags, discussed reasons to return.

## 2012-08-04 ENCOUNTER — Encounter: Payer: Medicaid Other | Admitting: Family Medicine

## 2012-08-06 ENCOUNTER — Encounter: Payer: Self-pay | Admitting: Family Medicine

## 2012-09-07 ENCOUNTER — Telehealth: Payer: Self-pay | Admitting: Family Medicine

## 2012-09-07 DIAGNOSIS — E038 Other specified hypothyroidism: Secondary | ICD-10-CM

## 2012-09-07 DIAGNOSIS — E039 Hypothyroidism, unspecified: Secondary | ICD-10-CM

## 2012-09-07 MED ORDER — LEVOTHYROXINE SODIUM 200 MCG PO TABS: 200.0000 ug | ORAL_TABLET | Freq: Every day | ORAL | Status: DC

## 2012-09-07 NOTE — Telephone Encounter (Signed)
[name] calling about refill for Mr. Frank Nguyen's thyroid medication.  Pharmacy was to have sent request on Monday.  Please inform patient if received and when sent to them.  Patient is completely out of his med.

## 2012-09-07 NOTE — Telephone Encounter (Signed)
Returned call to patient.  Last office visit 01/2012.  Informed patient he needs appt with PCP.  Appt scheduled for 09/29/12 at 9:30 am.  Thyroid med refilled in Epic for #30 x no refills.  Gaylene Brooks, RN

## 2012-09-08 ENCOUNTER — Other Ambulatory Visit: Payer: Self-pay | Admitting: Family Medicine

## 2012-09-08 DIAGNOSIS — E785 Hyperlipidemia, unspecified: Secondary | ICD-10-CM

## 2012-09-08 MED ORDER — SIMVASTATIN 40 MG PO TABS: 40.0000 mg | ORAL_TABLET | Freq: Every day | ORAL | Status: DC

## 2012-09-17 ENCOUNTER — Telehealth: Payer: Self-pay | Admitting: Family Medicine

## 2012-09-17 DIAGNOSIS — E039 Hypothyroidism, unspecified: Secondary | ICD-10-CM

## 2012-09-17 DIAGNOSIS — E038 Other specified hypothyroidism: Secondary | ICD-10-CM

## 2012-09-17 MED ORDER — LEVOTHYROXINE SODIUM 200 MCG PO TABS: 200.0000 ug | ORAL_TABLET | Freq: Every day | ORAL | Status: DC

## 2012-09-17 NOTE — Telephone Encounter (Signed)
Will forward to Dr Williamson 

## 2012-09-17 NOTE — Telephone Encounter (Signed)
Called pt. Informed. .Frank Nguyen  

## 2012-09-17 NOTE — Telephone Encounter (Signed)
Medication sent to pharmacy. Please let patient know. Thank you.

## 2012-09-17 NOTE — Telephone Encounter (Signed)
Called patient to reschedule appt to 05/30 and he said he will not have enough of his thyroid medication to last until then.  He uses [facility].

## 2012-09-29 ENCOUNTER — Ambulatory Visit: Payer: Medicaid Other | Admitting: Family Medicine

## 2012-10-08 ENCOUNTER — Encounter: Payer: Self-pay | Admitting: Family Medicine

## 2012-10-08 ENCOUNTER — Ambulatory Visit (INDEPENDENT_AMBULATORY_CARE_PROVIDER_SITE_OTHER): Payer: Medicaid Other | Admitting: Family Medicine

## 2012-10-08 VITALS — BP 140/67 | HR 73 | Ht 72.0 in | Wt 309.0 lb

## 2012-10-08 DIAGNOSIS — E669 Obesity, unspecified: Secondary | ICD-10-CM

## 2012-10-08 DIAGNOSIS — F17200 Nicotine dependence, unspecified, uncomplicated: Secondary | ICD-10-CM

## 2012-10-08 DIAGNOSIS — Z720 Tobacco use: Secondary | ICD-10-CM

## 2012-10-08 NOTE — Patient Instructions (Addendum)
Frank Nguyen,   Please continue taking the cholesterol medication each night. Also, please think about your strategies for quitting smoking. This is going to reduce your risk of heart disease in the future.   Also, I will always encourage you to exercise 5 times a day for 30 minutes.   Please follow up in 6 months.   Dr. Clinton Sawyer

## 2012-10-08 NOTE — Progress Notes (Signed)
  Subjective:    Patient ID: Frank Nguyen, male    DOB: [birth year], 47 y.o.   MRN: [id number]  HPI  47 year old M with hypothyroidism, obesity, tobacco abuse and increased CV disease risk who presents for evaluation.   Smoking Cessation - Smoking 7-8 cigarettes per day, smoking history > 20 pack years  - Desire to quit smoking 10/10, motivation is being healthy for children - Thinks he is not addicted to nicotine but has habit of smoking is the most difficult thing of break  - His biggest trigger is work b/c he only smokes at home - He is uncertain of the time that he wants to quit - He does not want to use nicotine replacement  - He previously used alcohol and drugs and quit those, which given him confidence  - Barriers to quitting: Concern for weight gain   Weight  Wt Readings from Last 5 Encounters:  10/08/12 309 lb (140.161 kg)  07/23/12 313 lb (141.976 kg)  01/28/12 292 lb 3 oz (132.535 kg)  09/24/11 290 lb (131.543 kg)  07/25/11 291 lb (131.997 kg)   - Patient uncertain of reason for weight gain over the last year - Concerned that stopping cigarettes will increased weight gain - Exercise: He does not regularly exercise  > Barrier to exercise - fatigue after work - Diet: cannot provide detailed history of diet due does state decreased fried food, still drinks numerous sugar sweetened beverages daily - last TSH Feb 2014, WNL   Review of Systems Negative for chest pain, SOB, edema     Objective:   Physical Exam  BP 140/67  Pulse 73  Ht 6' (1.829 m)  Wt 309 lb (140.161 kg)  BMI 41.9 kg/m2 Gen: middle aged AAM, obese, non ill appearing, pleasant and conversant CV: RRR, no murmur, no JVD Lungs: clear to auscultation bilaterally  Extremities: no edema     Assessment & Plan:

## 2012-10-10 NOTE — Assessment & Plan Note (Signed)
Patient counseled extensively about tobacco cessation. He is in the preparation phase of the stages of change model. His biggest barrier will be habits at work. Continue to encouraged cessation.

## 2012-10-10 NOTE — Assessment & Plan Note (Signed)
Patient encouraged to increase exercise to 30 min 5x a week.

## 2013-01-21 ENCOUNTER — Encounter: Payer: Self-pay | Admitting: Family Medicine

## 2013-01-21 ENCOUNTER — Ambulatory Visit (INDEPENDENT_AMBULATORY_CARE_PROVIDER_SITE_OTHER): Payer: Medicaid Other | Admitting: Family Medicine

## 2013-01-21 VITALS — BP 152/99 | HR 71 | Ht 72.0 in | Wt 294.5 lb

## 2013-01-21 DIAGNOSIS — M25572 Pain in left ankle and joints of left foot: Secondary | ICD-10-CM

## 2013-01-21 DIAGNOSIS — M25579 Pain in unspecified ankle and joints of unspecified foot: Secondary | ICD-10-CM

## 2013-01-21 MED ORDER — TRAMADOL HCL 50 MG PO TABS: 50.0000 mg | ORAL_TABLET | Freq: Three times a day (TID) | ORAL | Status: DC | PRN

## 2013-01-21 NOTE — Progress Notes (Signed)
  Subjective:    Patient ID: Frank Nguyen, male    DOB: Nov 04, 1965, 47 y.o.   MRN: [id number]  HPI  Left foot and ankle pain for one year duration. The pain is located in the left lateral ankle. Has been progressing. No specific injury or trauma. 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 The pain is relieved by taking tramadol. Patient has not noticed swellling. It does wake him from sleep. Hx of imaging: none   Past medical history: no history of surgery or injury to left ankle  Social: patient works at a Museum/gallery curator in [employer]  Current Outpatient Prescriptions on File Prior to Visit  Medication Sig Dispense Refill  . cyclobenzaprine (FLEXERIL) 10 MG tablet Take 1 tablet (10 mg total) by mouth 3 (three) times daily as needed for muscle spasms.  30 tablet  0  . ibuprofen (ADVIL,MOTRIN) 600 MG tablet Take 1 tablet (600 mg total) by mouth every 8 (eight) hours as needed for pain.  30 tablet  0  . levothyroxine (SYNTHROID, LEVOTHROID) 200 MCG tablet Take 1 tablet (200 mcg total) by mouth daily.  30 tablet  5  . simvastatin (ZOCOR) 20 MG tablet Take 1 tablet (20 mg total) by mouth at bedtime.  90 tablet  3  . simvastatin (ZOCOR) 40 MG tablet Take 1 tablet (40 mg total) by mouth at bedtime.  90 tablet  3   No current facility-administered medications on file prior to visit.     Review of Systems See history of present illness    Objective:   Physical Exam BP 152/99  Pulse 71  Ht 6' (1.829 m)  Wt 294 lb 8 oz (133.584 kg)  BMI 39.93 kg/m2 Gen. Middle-aged American male, obese, well-appearing, pleasant and conversant Left ankle: mild edema posterior to left lateral malleolus, minimal tenderness to palpation, 5/5 strength of dorsiflexion, plantar flexion, inversion, and inversion of the foot; negative Thompson test, negative drawer test; standing evaluation revealed bilateral pronation and flattened medial arches Neurologic:  sensation intact to light touch over left foot and ankle, Achilles reflex intact Skin: warm and well perfused her left foot and ankle       Assessment & Plan:

## 2013-01-21 NOTE — Patient Instructions (Signed)
I think that your pain may be due to impingment of the ankle joint from your flat arches and rolling in of your feet. It could also be due to irritation of the tendons behind the ankle bone. Right now, we need to rule out a stress fracture with the X-ray. You will hear from my office today about this.   For your part, I want you to do three things:  1. Take tramadol as needed 2. Rub aspercreme around the ankle bone three times a day 3. Ice in a bucket at the end of the day.   Come back in 2-3 weeks for follow up.   Sincerely,

## 2013-01-22 ENCOUNTER — Ambulatory Visit (HOSPITAL_COMMUNITY)
Admission: RE | Admit: 2013-01-22 | Discharge: 2013-01-22 | Disposition: A | Discharge status: Home / Self Care | Payer: Medicaid Other | Source: Ambulatory Visit | Attending: Family Medicine | Admitting: Family Medicine

## 2013-01-22 DIAGNOSIS — M25572 Pain in left ankle and joints of left foot: Secondary | ICD-10-CM

## 2013-01-22 DIAGNOSIS — M25579 Pain in unspecified ankle and joints of unspecified foot: Secondary | ICD-10-CM | POA: Insufficient documentation

## 2013-01-23 ENCOUNTER — Telehealth: Payer: Self-pay | Admitting: Family Medicine

## 2013-01-23 NOTE — Telephone Encounter (Signed)
Please let patient know that his X-ray did not show any fractures and that the ankle joint looked normal.

## 2013-01-24 NOTE — Telephone Encounter (Signed)
Pt notified.  Haward Pope L, CMA  

## 2013-04-26 ENCOUNTER — Telehealth: Payer: Self-pay | Admitting: Family Medicine

## 2013-04-26 DIAGNOSIS — E039 Hypothyroidism, unspecified: Secondary | ICD-10-CM

## 2013-04-26 DIAGNOSIS — E038 Other specified hypothyroidism: Secondary | ICD-10-CM

## 2013-04-26 MED ORDER — LEVOTHYROXINE SODIUM 200 MCG PO TABS: 200.0000 ug | ORAL_TABLET | Freq: Every day | ORAL | Status: DC

## 2013-04-26 NOTE — Telephone Encounter (Signed)
Prescription sent

## 2013-04-26 NOTE — Telephone Encounter (Signed)
Pt notified.  Omunique Pederson L, CMA  

## 2013-04-26 NOTE — Telephone Encounter (Signed)
Needs refill on synthroid. "can it be rush order?" Pharmacy is [facility] on [address]

## 2013-04-26 NOTE — Telephone Encounter (Signed)
Will fwd to MD for review.  Hawkin Charo L, CMA  

## 2013-11-17 ENCOUNTER — Other Ambulatory Visit: Payer: Self-pay | Admitting: Family Medicine

## 2013-11-17 NOTE — Telephone Encounter (Signed)
Patient will need to make an appointment to see [place] for this refill. If you could call him that would be fantastic.  Thank you!

## 2013-11-18 NOTE — Telephone Encounter (Signed)
Called and left message for pt to call office for an appointment to refill meds.  The home phone # has been disconnected but the cell phone has a voice message. Shelly

## 2013-11-25 ENCOUNTER — Telehealth: Payer: Self-pay | Admitting: Family Medicine

## 2013-11-25 ENCOUNTER — Other Ambulatory Visit: Payer: Self-pay | Admitting: Family Medicine

## 2013-11-25 DIAGNOSIS — E038 Other specified hypothyroidism: Secondary | ICD-10-CM

## 2013-11-25 MED ORDER — LEVOTHYROXINE SODIUM 200 MCG PO TABS: 200.0000 ug | ORAL_TABLET | Freq: Every day | ORAL | Status: DC

## 2013-11-25 NOTE — Telephone Encounter (Signed)
Patient out of his levothyroxine.  Need to have enough until appt on 7/22 @ 3:30.  Send to [facility] on [address]

## 2013-11-25 NOTE — Telephone Encounter (Signed)
Refilled for 30 days 

## 2013-11-25 NOTE — Telephone Encounter (Signed)
Patient asked for refill of his Levothyroxine. I refused this request and asked him to schedule an appointment to see me. This is now his second request for a refill of levothyroxine; he states that he needs enough to get to his appointment with me on July 22. This is a very reasonable request. I have refilled his normal prescription of 30 levothyroxine 200 mcg with no additional refills.  I plan to recheck his TSH levels at his next appointment. To ensure that a dose of this magnitude is appropriately treating his hypothyroidism.

## 2013-11-25 NOTE — Telephone Encounter (Signed)
I refilled his Synthroid for 30 days. I look forward to meeting him on July 22 at his followup appointment.  Please let him know that he has a prescription waiting for him at his listed pharmacy.  Thank you!!

## 2013-11-28 NOTE — Telephone Encounter (Signed)
LMOVM informing pt that "rx you requested was sent to your pharmacy and he looks forward to seeing you at your appt." Alanda Colton, Salome Spotted

## 2013-11-30 ENCOUNTER — Encounter: Payer: Self-pay | Admitting: Family Medicine

## 2013-11-30 ENCOUNTER — Ambulatory Visit (INDEPENDENT_AMBULATORY_CARE_PROVIDER_SITE_OTHER): Payer: Medicaid Other | Admitting: Family Medicine

## 2013-11-30 VITALS — BP 118/78 | HR 85 | Ht 72.0 in | Wt 296.0 lb

## 2013-11-30 DIAGNOSIS — E785 Hyperlipidemia, unspecified: Secondary | ICD-10-CM

## 2013-11-30 DIAGNOSIS — E669 Obesity, unspecified: Secondary | ICD-10-CM

## 2013-11-30 DIAGNOSIS — E038 Other specified hypothyroidism: Secondary | ICD-10-CM

## 2013-11-30 DIAGNOSIS — E7800 Pure hypercholesterolemia, unspecified: Secondary | ICD-10-CM

## 2013-11-30 MED ORDER — LEVOTHYROXINE SODIUM 200 MCG PO TABS: 200.0000 ug | ORAL_TABLET | Freq: Every day | ORAL | Status: AC

## 2013-11-30 MED ORDER — SIMVASTATIN 40 MG PO TABS: 40.0000 mg | ORAL_TABLET | Freq: Every day | ORAL | Status: DC

## 2013-11-30 NOTE — Assessment & Plan Note (Signed)
Patient states that he tries to watch weeks. There some days that he goes without eating very much at all. He states that he drinks a significant amount of water throughout the day do to the water losses he loses at work. He also states that he drinks pop and juice quite frequently. I urged him to try to cut back on simple beverages like his pop/juice in order to reduce calorie intake. I also discussed reducing portion size as an effective way to teach yourself good eating habits.

## 2013-11-30 NOTE — Assessment & Plan Note (Signed)
Patient states it is no longer taking his hyperlipidemia (Zocor). Patient has not had his lipid panel taken in over year. I've asked patient to schedule an appointment to have his fasting lipid panel taken within the next week. I also informed him that if this panel returns abnormal that I would urge him to restart taking Zocor. Patient voices understanding of these instructions and informed me that he would begin taking this medication regularly.

## 2013-11-30 NOTE — Assessment & Plan Note (Addendum)
Patient has been well controlled on his current dosage of Synthroid. He reports no new issues. Denies any symptoms of tremors, nausea, vomiting, changes in vision, weight loss or weight gain, hot or cold intolerance. He states that he is very compliant with his medications.   A medication refill was provided.

## 2013-11-30 NOTE — Progress Notes (Signed)
Frank Lynch, MD Phone: [phone]  Subjective:  Chief complaint-noted  Pt Here for meeting his new PCP and medication refill. Patient and wife were both present for this visit. They said they were interested in meeting his new primary care provider. He voiced concern for the refill enough his Synthroid prescription. We also discussed topics of his weight, proper diet habits, his sleeping habits, as well as some persistent left ankle pain.  Review of Systems  Constitutional: Positive for malaise/fatigue and diaphoresis. Negative for fever, chills and weight loss.  HENT: Negative for hearing loss.   Respiratory: Negative for cough.   Cardiovascular: Negative for chest pain.  Gastrointestinal: Positive for heartburn. Negative for nausea and vomiting.  Skin: Negative for rash.  Neurological: Negative for headaches.     Past Medical History Patient Active Problem List   Diagnosis Date Noted  . Left ankle pain 01/21/2013  . Low back pain 07/23/2012  . Disordered sleep 01/28/2012  . Plantar fasciitis, bilateral 01/28/2012  . Hypothyroidism, secondary 06/17/2011  . Tobacco abuse 06/17/2011  . Elevated LDL cholesterol level 06/17/2011  . Obesity (BMI 30-39.9) 06/17/2011    Medications- reviewed and updated Current Outpatient Prescriptions  Medication Sig Dispense Refill  . levothyroxine (SYNTHROID, LEVOTHROID) 200 MCG tablet Take 1 tablet (200 mcg total) by mouth daily.  30 tablet  0  . simvastatin (ZOCOR) 40 MG tablet Take 1 tablet (40 mg total) by mouth at bedtime.  90 tablet  3  . traMADol (ULTRAM) 50 MG tablet Take 1 tablet (50 mg total) by mouth every 8 (eight) hours as needed for pain.  60 tablet  0   No current facility-administered medications for this visit.    Objective: BP 118/78  Pulse 85  Ht 6' (1.829 m)  Wt 296 lb (134.265 kg)  BMI 40.14 kg/m2 Gen: NAD, alert, cooperative with exam HEENT: NCAT, EOMI, PERRL CV: Regular rate, rhythm is irregular, good S1/S2, no  murmur Resp: CTABL, no wheezes, non-labored Abd: SNTND, obese,  BS present, no guarding or organomegaly Ext: No edema, warm Neuro: Alert and oriented, No gross deficits, speaks clearly and organized.   Assessment/Plan:  Hypothyroidism, secondary Patient has been well controlled on his current dosage of Synthroid. He reports no new issues. Denies any symptoms of tremors, nausea, vomiting, changes in vision, weight loss or weight gain, hot or cold intolerance. He states that he is very compliant with his medications.   A medication refill was provided.  Elevated LDL cholesterol level Patient states it is no longer taking his hyperlipidemia (Zocor). Patient has not had his lipid panel taken in over year. I've asked patient to schedule an appointment to have his fasting lipid panel taken within the next week. I also informed him that if this panel returns abnormal that I would urge him to restart taking Zocor. Patient voices understanding of these instructions and informed me that he would begin taking this medication regularly.    Orders Placed This Encounter  Procedures  . COMPLETE METABOLIC PANEL WITH GFR    Standing Status: Future     Number of Occurrences:      Standing Expiration Date: 12/01/2014  . Lipid Panel    Standing Status: Future     Number of Occurrences:      Standing Expiration Date: 12/01/2014  . TSH    Standing Status: Future     Number of Occurrences:      Standing Expiration Date: 12/01/2014    No orders of the defined types were  placed in this encounter.

## 2013-11-30 NOTE — Patient Instructions (Signed)
It was a pleasure meeting you today. Please have a Fasting blood test drawn within the next week. I will call you if there are any significant abnormalities which we would need to address with a follow up appointment. Otherwise please come back to follow up with me in the next 6-12 months.

## 2013-11-30 NOTE — Progress Notes (Signed)
Patient ID: Frank Nguyen, male   DOB: June 24, 1965, 48 y.o.   MRN: [id number] Discussed with Dr. Alease Frame.  Agree with his management and documentation.

## 2013-12-01 ENCOUNTER — Other Ambulatory Visit: Payer: Self-pay

## 2013-12-01 DIAGNOSIS — E785 Hyperlipidemia, unspecified: Secondary | ICD-10-CM

## 2013-12-01 DIAGNOSIS — E038 Other specified hypothyroidism: Secondary | ICD-10-CM

## 2013-12-01 LAB — COMPLETE METABOLIC PANEL WITH GFR
ALK PHOS: 108 U/L (ref 39–117)
ALT: 17 U/L (ref 0–53)
AST: 19 U/L (ref 0–37)
Albumin: 4 g/dL (ref 3.5–5.2)
BUN: 12 mg/dL (ref 6–23)
CO2: 27 mEq/L (ref 19–32)
CREATININE: 1.01 mg/dL (ref 0.50–1.35)
Calcium: 8.8 mg/dL (ref 8.4–10.5)
Chloride: 104 mEq/L (ref 96–112)
GFR, Est African American: >89 mL/min
GFR, Est Non African American: 88 mL/min
Glucose, Bld: 101 mg/dL — ABNORMAL HIGH (ref 70–99)
POTASSIUM: 4.3 meq/L (ref 3.5–5.3)
Sodium: 137 mEq/L (ref 135–145)
Total Bilirubin: 0.4 mg/dL (ref 0.2–1.2)
Total Protein: 7 g/dL (ref 6.0–8.3)

## 2013-12-01 LAB — LIPID PANEL
CHOL/HDL RATIO: 4.5 ratio
CHOLESTEROL: 193 mg/dL (ref 0–200)
HDL: 43 mg/dL (ref >39)
LDL Cholesterol: 122 mg/dL — ABNORMAL HIGH (ref 0–99)
TRIGLYCERIDES: 140 mg/dL (ref <150)
VLDL: 28 mg/dL (ref 0–40)

## 2013-12-01 LAB — TSH: TSH: 0.462 u[IU]/mL (ref 0.350–4.500)

## 2013-12-01 NOTE — Progress Notes (Signed)
Patient ID: Frank Nguyen, male   DOB: Sep 28, 1965, 48 y.o.   MRN: [id number] Discussed with Dr. Alease Frame.  Agree with his documentation and management.

## 2013-12-01 NOTE — Progress Notes (Signed)
CMP,FLP AND TSH DONE TODAY Frank Nguyen

## 2014-05-02 ENCOUNTER — Emergency Department: Payer: Self-pay | Admitting: Emergency Medicine

## 2014-09-28 ENCOUNTER — Encounter (HOSPITAL_COMMUNITY): Payer: Self-pay | Admitting: Emergency Medicine

## 2014-09-28 ENCOUNTER — Emergency Department (HOSPITAL_COMMUNITY): Payer: Non-veteran care

## 2014-09-28 ENCOUNTER — Inpatient Hospital Stay (HOSPITAL_COMMUNITY)
Admission: EM | Admit: 2014-09-28 | Discharge: 2014-09-30 | DRG: 247 | Disposition: A | Discharge status: Home / Self Care | Payer: Non-veteran care | Attending: Internal Medicine | Admitting: Internal Medicine

## 2014-09-28 DIAGNOSIS — M545 Low back pain, unspecified: Secondary | ICD-10-CM | POA: Diagnosis present

## 2014-09-28 DIAGNOSIS — I10 Essential (primary) hypertension: Secondary | ICD-10-CM | POA: Diagnosis present

## 2014-09-28 DIAGNOSIS — E669 Obesity, unspecified: Secondary | ICD-10-CM | POA: Diagnosis present

## 2014-09-28 DIAGNOSIS — F17210 Nicotine dependence, cigarettes, uncomplicated: Secondary | ICD-10-CM | POA: Diagnosis present

## 2014-09-28 DIAGNOSIS — I25110 Atherosclerotic heart disease of native coronary artery with unstable angina pectoris: Secondary | ICD-10-CM | POA: Diagnosis not present

## 2014-09-28 DIAGNOSIS — Z955 Presence of coronary angioplasty implant and graft: Secondary | ICD-10-CM

## 2014-09-28 DIAGNOSIS — F1010 Alcohol abuse, uncomplicated: Secondary | ICD-10-CM | POA: Diagnosis present

## 2014-09-28 DIAGNOSIS — R000 Tachycardia, unspecified: Secondary | ICD-10-CM | POA: Diagnosis present

## 2014-09-28 DIAGNOSIS — Z9989 Dependence on other enabling machines and devices: Secondary | ICD-10-CM

## 2014-09-28 DIAGNOSIS — E785 Hyperlipidemia, unspecified: Secondary | ICD-10-CM | POA: Diagnosis present

## 2014-09-28 DIAGNOSIS — R079 Chest pain, unspecified: Secondary | ICD-10-CM

## 2014-09-28 DIAGNOSIS — E038 Other specified hypothyroidism: Secondary | ICD-10-CM | POA: Diagnosis present

## 2014-09-28 DIAGNOSIS — Z6830 Body mass index (BMI) 30.0-30.9, adult: Secondary | ICD-10-CM

## 2014-09-28 DIAGNOSIS — Z720 Tobacco use: Secondary | ICD-10-CM | POA: Diagnosis present

## 2014-09-28 DIAGNOSIS — G4733 Obstructive sleep apnea (adult) (pediatric): Secondary | ICD-10-CM | POA: Diagnosis present

## 2014-09-28 DIAGNOSIS — E7800 Pure hypercholesterolemia, unspecified: Secondary | ICD-10-CM | POA: Diagnosis present

## 2014-09-28 DIAGNOSIS — I200 Unstable angina: Secondary | ICD-10-CM | POA: Insufficient documentation

## 2014-09-28 DIAGNOSIS — E890 Postprocedural hypothyroidism: Secondary | ICD-10-CM | POA: Diagnosis present

## 2014-09-28 HISTORY — DX: Hypothyroidism, unspecified: E03.9

## 2014-09-28 HISTORY — DX: Hyperlipidemia, unspecified: E78.5

## 2014-09-28 HISTORY — DX: Tobacco use: Z72.0

## 2014-09-28 HISTORY — DX: Essential (primary) hypertension: I10

## 2014-09-28 LAB — BASIC METABOLIC PANEL
ANION GAP: 11 (ref 5–15)
BUN: 15 mg/dL (ref 6–20)
CHLORIDE: 103 mmol/L (ref 101–111)
CO2: 24 mmol/L (ref 22–32)
Calcium: 9.6 mg/dL (ref 8.9–10.3)
Creatinine, Ser: 1.18 mg/dL (ref 0.61–1.24)
GFR calc non Af Amer: >60 mL/min (ref >60)
Glucose, Bld: 104 mg/dL — ABNORMAL HIGH (ref 65–99)
POTASSIUM: 3.9 mmol/L (ref 3.5–5.1)
Sodium: 138 mmol/L (ref 135–145)

## 2014-09-28 LAB — I-STAT TROPONIN, ED: TROPONIN I, POC: 0 ng/mL (ref 0.00–0.08)

## 2014-09-28 LAB — CBC
HEMATOCRIT: 41.6 % (ref 39.0–52.0)
HEMOGLOBIN: 13.9 g/dL (ref 13.0–17.0)
MCH: 27 pg (ref 26.0–34.0)
MCHC: 33.4 g/dL (ref 30.0–36.0)
MCV: 80.8 fL (ref 78.0–100.0)
PLATELETS: 294 10*3/uL (ref 150–400)
RBC: 5.15 MIL/uL (ref 4.22–5.81)
RDW: 13.8 % (ref 11.5–15.5)
WBC: 8.9 10*3/uL (ref 4.0–10.5)

## 2014-09-28 LAB — BRAIN NATRIURETIC PEPTIDE: B NATRIURETIC PEPTIDE 5: 9.7 pg/mL (ref 0.0–100.0)

## 2014-09-28 MED ORDER — NITROGLYCERIN 0.4 MG SL SUBL
0.4000 mg | SUBLINGUAL_TABLET | SUBLINGUAL | Status: AC | PRN
  Administered 2014-09-29 (×3): 0.4 mg via SUBLINGUAL
  Filled 2014-09-28: qty 1

## 2014-09-28 MED ORDER — ASPIRIN 81 MG PO CHEW
324.0000 mg | CHEWABLE_TABLET | Freq: Once | ORAL | Status: AC
  Administered 2014-09-29: 324 mg via ORAL
  Filled 2014-09-28: qty 4

## 2014-09-28 NOTE — ED Provider Notes (Signed)
This chart was scribed for [place], DO by Meriel Pica, ED Scribe. This patient was seen in room A05C/A05C and the patient's care was started 11:50 PM.  TIME SEEN: 11:50pm   CHIEF COMPLAINT: Chest Pain   HPI: HPI Comments: Frank Nguyen is a 49 y.o. male with HTN, HLD, tobacco use and PMhx of thyroid tumor in 2010 and sleep apnea in 2009, who presents to the Emergency Department complaining of waxing and waning chest pain that occurred over the past 7 days in which he describes as a tightness in his chest that radiates into his throat. Associated diaphoresis, shortness of breath and palpitations. Pt reports chest pain is exacerbated on exertion with no alleviating factors. He states his heart rate increases and pulse 'seems loud'. Pt reports he has had a stress test in 2009. Similar symptoms of rapid heart beat in the past but with no associated with chest pain. Pt has not had cardiac cath.  No history of PE or DVT. No recent prolonged immobilization such as long flight or auscultation, fracture, surgery, trauma.   PCP is at the [place]    ROS: See HPI Constitutional: no fever  Eyes: no drainage  ENT: no runny nose   Cardiovascular:   chest pain  Resp:  SOB  GI: no vomiting GU: no dysuria Integumentary: no rash  Allergy: no hives  Musculoskeletal: no leg swelling  Neurological: no slurred speech ROS otherwise negative  PAST MEDICAL HISTORY/PAST SURGICAL HISTORY:  Past Medical History  Diagnosis Date  . Tumor, thyroid 2010    No records seen, only self-reported   . Sleep apnea 2009    MEDICATIONS:  Prior to Admission medications   Medication Sig Start Date End Date Taking? Authorizing Provider  levothyroxine (SYNTHROID, LEVOTHROID) 200 MCG tablet Take 1 tablet (200 mcg total) by mouth daily. 11/30/13 11/30/14  Elberta Leatherwood, MD  simvastatin (ZOCOR) 40 MG tablet Take 1 tablet (40 mg total) by mouth at bedtime. 11/30/13   Elberta Leatherwood, MD  traMADol (ULTRAM) 50 MG tablet Take  1 tablet (50 mg total) by mouth every 8 (eight) hours as needed for pain. 01/21/13   Angelica Ran, MD    ALLERGIES:  No Known Allergies  SOCIAL HISTORY:  History  Substance Use Topics  . Smoking status: Current Some Day Smoker -- 0.00 packs/day    Types: Cigarettes  . Smokeless tobacco: Not on file  . Alcohol Use: No    FAMILY HISTORY: Family History  Problem Relation Age of Onset  . Diabetes Mother   . Diabetes Father   . Cancer Sister     Breast  . Diabetes Brother   . Alzheimer's disease      grandparents, aunts, uncles     EXAM: Triage Vitals: BP 152/78 mmHg  Pulse 100  Temp(Src) 98 F (36.7 C) (Oral)  Resp 20  SpO2 98%  CONSTITUTIONAL: Alert and oriented and responds appropriately to questions. Well-appearing; well-nourished, appears uncomfortable but nontoxic HEAD: Normocephalic EYES: Conjunctivae clear, PERRL ENT: normal nose; no rhinorrhea; moist mucous membranes; pharynx without lesions noted NECK: Supple, no meningismus, no LAD  CARD: RRR; S1 and S2 appreciated; no murmurs, no clicks, no rubs, no gallops RESP: Normal chest excursion without splinting or tachypnea; breath sounds clear and equal bilaterally; no wheezes, no rhonchi, no rales, no hypoxia or respiratory distress, speaking full sentences ABD/GI: Normal bowel sounds; non-distended; soft, non-tender, no rebound, no guarding, no peritoneal signs BACK:  The back appears normal and  is non-tender to palpation, there is no CVA tenderness EXT: Normal ROM in all joints; non-tender to palpation; no edema; normal capillary refill; no cyanosis, no calf tenderness or swelling    SKIN: Normal color for age and race; warm NEURO: Moves all extremities equally, sensation to light touch intact diffusely, cranial nerves II through XII intact PSYCH: The patient's mood and manner are appropriate. Grooming and personal hygiene are appropriate.  MEDICAL DECISION MAKING: Patient here with chest pain, shortness of  breath, diaphoresis that is exertional. Intermittent over the past week but worse today. Hemodynamically stable. EKG shows no new ischemic changes since December 2015 but he does have multiple risk factors for ACS including hypertension, hyperlipidemia, tobacco use and family history of a mother with CAD/CHF. We'll give aspirin, nitroglycerin but I feel he will need admission for chest pain rule out. First troponin is negative. Chest x-ray clear.  HEART score is 5.    ED PROGRESS: Discussed with Dr. Blaine Hamper with hospitalist service who agrees on admission to telemetry, observation.    EKG Interpretation  Date/Time:  Thursday Sep 28 2014 20:57:55 EDT Ventricular Rate:  100 PR Interval:  132 QRS Duration: 74 QT Interval:  342 QTC Calculation: 441 R Axis:   33 Text Interpretation:  Sinus rhythm with Premature atrial complexes Nonspecific ST abnormality Abnormal ECG No significant change since last tracing Confirmed by Omunique Pederson,  DO, Swade Shonka [phone]) on 09/28/2014 11:48:18 PM         I personally performed the services described in this documentation, which was scribed in my presence. The recorded information has been reviewed and is accurate.      [place], DO 09/29/14 [phone]

## 2014-09-28 NOTE — ED Notes (Signed)
Pt. reports central chest pain/tightness with palpitations and SOB onset this evening  . Denies nausea or diaphoresis .

## 2014-09-29 ENCOUNTER — Inpatient Hospital Stay (HOSPITAL_COMMUNITY): Payer: Non-veteran care

## 2014-09-29 ENCOUNTER — Ambulatory Visit (HOSPITAL_COMMUNITY): Payer: Non-veteran care

## 2014-09-29 ENCOUNTER — Encounter (HOSPITAL_COMMUNITY)
Admission: EM | Disposition: A | Discharge status: Home / Self Care | Payer: Non-veteran care | Source: Home / Self Care | Attending: Internal Medicine

## 2014-09-29 ENCOUNTER — Encounter (HOSPITAL_COMMUNITY): Payer: Self-pay | Admitting: Internal Medicine

## 2014-09-29 DIAGNOSIS — E038 Other specified hypothyroidism: Secondary | ICD-10-CM

## 2014-09-29 DIAGNOSIS — E890 Postprocedural hypothyroidism: Secondary | ICD-10-CM | POA: Diagnosis present

## 2014-09-29 DIAGNOSIS — E780 Pure hypercholesterolemia: Secondary | ICD-10-CM

## 2014-09-29 DIAGNOSIS — Z9989 Dependence on other enabling machines and devices: Secondary | ICD-10-CM

## 2014-09-29 DIAGNOSIS — E785 Hyperlipidemia, unspecified: Secondary | ICD-10-CM | POA: Diagnosis present

## 2014-09-29 DIAGNOSIS — I10 Essential (primary) hypertension: Secondary | ICD-10-CM | POA: Diagnosis present

## 2014-09-29 DIAGNOSIS — G4733 Obstructive sleep apnea (adult) (pediatric): Secondary | ICD-10-CM | POA: Diagnosis present

## 2014-09-29 DIAGNOSIS — R000 Tachycardia, unspecified: Secondary | ICD-10-CM | POA: Diagnosis present

## 2014-09-29 DIAGNOSIS — F1010 Alcohol abuse, uncomplicated: Secondary | ICD-10-CM | POA: Diagnosis present

## 2014-09-29 DIAGNOSIS — Z6830 Body mass index (BMI) 30.0-30.9, adult: Secondary | ICD-10-CM | POA: Diagnosis not present

## 2014-09-29 DIAGNOSIS — I25110 Atherosclerotic heart disease of native coronary artery with unstable angina pectoris: Principal | ICD-10-CM

## 2014-09-29 DIAGNOSIS — I200 Unstable angina: Secondary | ICD-10-CM | POA: Insufficient documentation

## 2014-09-29 DIAGNOSIS — M545 Low back pain: Secondary | ICD-10-CM | POA: Diagnosis present

## 2014-09-29 DIAGNOSIS — R079 Chest pain, unspecified: Secondary | ICD-10-CM

## 2014-09-29 DIAGNOSIS — Z720 Tobacco use: Secondary | ICD-10-CM | POA: Diagnosis not present

## 2014-09-29 DIAGNOSIS — R9439 Abnormal result of other cardiovascular function study: Secondary | ICD-10-CM | POA: Diagnosis not present

## 2014-09-29 DIAGNOSIS — E669 Obesity, unspecified: Secondary | ICD-10-CM | POA: Diagnosis not present

## 2014-09-29 DIAGNOSIS — F17210 Nicotine dependence, cigarettes, uncomplicated: Secondary | ICD-10-CM | POA: Diagnosis present

## 2014-09-29 HISTORY — PX: CARDIAC CATHETERIZATION: SHX172

## 2014-09-29 LAB — COMPREHENSIVE METABOLIC PANEL
ALT: 26 U/L (ref 17–63)
AST: 28 U/L (ref 15–41)
Albumin: 3.4 g/dL — ABNORMAL LOW (ref 3.5–5.0)
Alkaline Phosphatase: 104 U/L (ref 38–126)
Anion gap: 8 (ref 5–15)
BUN: 14 mg/dL (ref 6–20)
CALCIUM: 9 mg/dL (ref 8.9–10.3)
CO2: 26 mmol/L (ref 22–32)
Chloride: 102 mmol/L (ref 101–111)
Creatinine, Ser: 1.01 mg/dL (ref 0.61–1.24)
GFR calc Af Amer: >60 mL/min (ref >60)
Glucose, Bld: 110 mg/dL — ABNORMAL HIGH (ref 65–99)
Potassium: 4 mmol/L (ref 3.5–5.1)
Sodium: 136 mmol/L (ref 135–145)
TOTAL PROTEIN: 6.9 g/dL (ref 6.5–8.1)
Total Bilirubin: 0.5 mg/dL (ref 0.3–1.2)

## 2014-09-29 LAB — LIPID PANEL
Cholesterol: 143 mg/dL (ref 0–200)
HDL: 37 mg/dL — AB (ref >40)
LDL CALC: 88 mg/dL (ref 0–99)
TRIGLYCERIDES: 89 mg/dL (ref <150)
Total CHOL/HDL Ratio: 3.9 RATIO
VLDL: 18 mg/dL (ref 0–40)

## 2014-09-29 LAB — RAPID URINE DRUG SCREEN, HOSP PERFORMED
Amphetamines: NOT DETECTED
Barbiturates: NOT DETECTED
Benzodiazepines: NOT DETECTED
COCAINE: NOT DETECTED
OPIATES: POSITIVE — AB
TETRAHYDROCANNABINOL: NOT DETECTED

## 2014-09-29 LAB — CBC
HCT: 40.7 % (ref 39.0–52.0)
Hemoglobin: 13.3 g/dL (ref 13.0–17.0)
MCH: 26.6 pg (ref 26.0–34.0)
MCHC: 32.7 g/dL (ref 30.0–36.0)
MCV: 81.4 fL (ref 78.0–100.0)
PLATELETS: 267 10*3/uL (ref 150–400)
RBC: 5 MIL/uL (ref 4.22–5.81)
RDW: 13.9 % (ref 11.5–15.5)
WBC: 7.8 10*3/uL (ref 4.0–10.5)

## 2014-09-29 LAB — HIV ANTIBODY (ROUTINE TESTING W REFLEX): HIV SCREEN 4TH GENERATION: NONREACTIVE

## 2014-09-29 LAB — TROPONIN I
Troponin I: <0.03 ng/mL (ref <0.031)
Troponin I: <0.03 ng/mL (ref <0.031)
Troponin I: <0.03 ng/mL (ref <0.031)

## 2014-09-29 LAB — GLUCOSE, CAPILLARY: Glucose-Capillary: 120 mg/dL — ABNORMAL HIGH (ref 65–99)

## 2014-09-29 LAB — PROTIME-INR
INR: 1.03 (ref 0.00–1.49)
Prothrombin Time: 13.7 seconds (ref 11.6–15.2)

## 2014-09-29 LAB — APTT: aPTT: 31 seconds (ref 24–37)

## 2014-09-29 LAB — POCT ACTIVATED CLOTTING TIME: Activated Clotting Time: 485 seconds

## 2014-09-29 SURGERY — LEFT HEART CATH AND CORONARY ANGIOGRAPHY
Anesthesia: LOCAL

## 2014-09-29 MED ORDER — CLOPIDOGREL BISULFATE 75 MG PO TABS
75.0000 mg | ORAL_TABLET | Freq: Every day | ORAL | Status: DC
  Administered 2014-09-30: 10:00:00 75 mg via ORAL
  Filled 2014-09-29: qty 1

## 2014-09-29 MED ORDER — SODIUM CHLORIDE 0.9 % IV SOLN
250.0000 mL | INTRAVENOUS | Status: DC | PRN
Start: 2014-09-29 — End: 2014-09-29

## 2014-09-29 MED ORDER — MIDAZOLAM HCL 2 MG/2ML IJ SOLN
INTRAMUSCULAR | Status: AC
  Filled 2014-09-29: qty 2

## 2014-09-29 MED ORDER — SODIUM CHLORIDE 0.9 % IJ SOLN: 3.0000 mL | INTRAMUSCULAR | Status: DC | PRN

## 2014-09-29 MED ORDER — FAMOTIDINE IN NACL 20-0.9 MG/50ML-% IV SOLN
INTRAVENOUS | Status: DC | PRN
  Administered 2014-09-29: 20 mg via INTRAVENOUS

## 2014-09-29 MED ORDER — NITROGLYCERIN 0.4 MG SL SUBL: 0.4000 mg | SUBLINGUAL_TABLET | SUBLINGUAL | Status: DC | PRN

## 2014-09-29 MED ORDER — SODIUM CHLORIDE 0.9 % IV SOLN: 250.0000 mL | INTRAVENOUS | Status: DC | PRN

## 2014-09-29 MED ORDER — ONDANSETRON HCL 4 MG/2ML IJ SOLN: 4.0000 mg | Freq: Three times a day (TID) | INTRAMUSCULAR | Status: DC | PRN

## 2014-09-29 MED ORDER — SODIUM CHLORIDE 0.9 % IJ SOLN: 3.0000 mL | Freq: Two times a day (BID) | INTRAMUSCULAR | Status: DC

## 2014-09-29 MED ORDER — SODIUM CHLORIDE 0.9 % WEIGHT BASED INFUSION
1.0000 mL/kg/h | INTRAVENOUS | Status: AC
  Administered 2014-09-29: 1 mL/kg/h via INTRAVENOUS

## 2014-09-29 MED ORDER — FENTANYL CITRATE (PF) 100 MCG/2ML IJ SOLN
INTRAMUSCULAR | Status: DC | PRN
  Administered 2014-09-29: 25 ug via INTRAVENOUS

## 2014-09-29 MED ORDER — NITROGLYCERIN 0.4 MG SL SUBL
0.4000 mg | SUBLINGUAL_TABLET | SUBLINGUAL | Status: DC | PRN
  Administered 2014-09-29: 0.4 mg via SUBLINGUAL

## 2014-09-29 MED ORDER — NITROGLYCERIN 1 MG/10 ML FOR IR/CATH LAB
INTRA_ARTERIAL | Status: AC
  Filled 2014-09-29: qty 10

## 2014-09-29 MED ORDER — SODIUM CHLORIDE 0.9 % IV SOLN
INTRAVENOUS | Status: DC
  Administered 2014-09-29: 01:00:00 via INTRAVENOUS

## 2014-09-29 MED ORDER — MIDAZOLAM HCL 2 MG/2ML IJ SOLN
INTRAMUSCULAR | Status: DC | PRN
  Administered 2014-09-29: 2 mg via INTRAVENOUS

## 2014-09-29 MED ORDER — VERAPAMIL HCL 2.5 MG/ML IV SOLN
INTRAVENOUS | Status: DC | PRN
  Administered 2014-09-29: 18:00:00 via INTRA_ARTERIAL

## 2014-09-29 MED ORDER — HEPARIN SODIUM (PORCINE) 1000 UNIT/ML IJ SOLN
INTRAMUSCULAR | Status: AC
  Filled 2014-09-29: qty 1

## 2014-09-29 MED ORDER — VERAPAMIL HCL 2.5 MG/ML IV SOLN
INTRAVENOUS | Status: AC
  Filled 2014-09-29: qty 2

## 2014-09-29 MED ORDER — MORPHINE SULFATE 2 MG/ML IJ SOLN: 2.0000 mg | INTRAMUSCULAR | Status: DC | PRN

## 2014-09-29 MED ORDER — ASPIRIN 81 MG PO CHEW
81.0000 mg | CHEWABLE_TABLET | Freq: Every day | ORAL | Status: DC
  Administered 2014-09-30: 10:00:00 81 mg via ORAL
  Filled 2014-09-29: qty 1

## 2014-09-29 MED ORDER — ONDANSETRON HCL 4 MG/2ML IJ SOLN: 4.0000 mg | Freq: Four times a day (QID) | INTRAMUSCULAR | Status: DC | PRN

## 2014-09-29 MED ORDER — ACETAMINOPHEN 650 MG RE SUPP
650.0000 mg | Freq: Four times a day (QID) | RECTAL | Status: DC | PRN
Start: 2014-09-29 — End: 2014-09-30

## 2014-09-29 MED ORDER — ACETAMINOPHEN 325 MG PO TABS: 650.0000 mg | ORAL_TABLET | Freq: Four times a day (QID) | ORAL | Status: DC | PRN

## 2014-09-29 MED ORDER — LEVOTHYROXINE SODIUM 100 MCG PO TABS
200.0000 ug | ORAL_TABLET | Freq: Every day | ORAL | Status: DC
  Administered 2014-09-29 – 2014-09-30 (×2): 200 ug via ORAL
  Filled 2014-09-29 (×2): qty 1
  Filled 2014-09-29: qty 2

## 2014-09-29 MED ORDER — FAMOTIDINE IN NACL 20-0.9 MG/50ML-% IV SOLN
INTRAVENOUS | Status: AC
  Filled 2014-09-29: qty 50

## 2014-09-29 MED ORDER — CARVEDILOL 3.125 MG PO TABS
3.1250 mg | ORAL_TABLET | Freq: Two times a day (BID) | ORAL | Status: DC
  Administered 2014-09-29 – 2014-09-30 (×3): 3.125 mg via ORAL
  Filled 2014-09-29 (×5): qty 1

## 2014-09-29 MED ORDER — HYDROCHLOROTHIAZIDE 25 MG PO TABS
25.0000 mg | ORAL_TABLET | Freq: Every day | ORAL | Status: DC
  Administered 2014-09-29: 25 mg via ORAL
  Filled 2014-09-29: qty 1

## 2014-09-29 MED ORDER — ATORVASTATIN CALCIUM 80 MG PO TABS
80.0000 mg | ORAL_TABLET | Freq: Every day | ORAL | Status: DC
  Filled 2014-09-29 (×2): qty 1

## 2014-09-29 MED ORDER — FENTANYL CITRATE (PF) 100 MCG/2ML IJ SOLN
INTRAMUSCULAR | Status: AC
  Filled 2014-09-29: qty 2

## 2014-09-29 MED ORDER — SODIUM CHLORIDE 0.9 % IV SOLN
250.0000 mg | INTRAVENOUS | Status: DC | PRN
  Administered 2014-09-29: 1.75 mg/kg/h via INTRAVENOUS

## 2014-09-29 MED ORDER — HEPARIN SODIUM (PORCINE) 1000 UNIT/ML IJ SOLN
INTRAMUSCULAR | Status: DC | PRN
  Administered 2014-09-29: 6000 [IU] via INTRAVENOUS

## 2014-09-29 MED ORDER — NITROGLYCERIN 0.4 MG SL SUBL
SUBLINGUAL_TABLET | SUBLINGUAL | Status: AC
  Filled 2014-09-29: qty 1

## 2014-09-29 MED ORDER — LIDOCAINE HCL (PF) 1 % IJ SOLN
INTRAMUSCULAR | Status: AC
Start: 2014-09-29 — End: 2014-09-29
  Filled 2014-09-29: qty 30

## 2014-09-29 MED ORDER — ACETAMINOPHEN 325 MG PO TABS: 650.0000 mg | ORAL_TABLET | ORAL | Status: DC | PRN

## 2014-09-29 MED ORDER — SODIUM CHLORIDE 0.9 % IV SOLN
0.2500 mg/kg/h | INTRAVENOUS | Status: AC
  Filled 2014-09-29: qty 250

## 2014-09-29 MED ORDER — BIVALIRUDIN 250 MG IV SOLR
INTRAVENOUS | Status: AC
  Filled 2014-09-29: qty 250

## 2014-09-29 MED ORDER — IOHEXOL 350 MG/ML SOLN
INTRAVENOUS | Status: DC | PRN
  Administered 2014-09-29: 140 mL via INTRA_ARTERIAL

## 2014-09-29 MED ORDER — MORPHINE SULFATE 2 MG/ML IJ SOLN: 1.0000 mg | INTRAMUSCULAR | Status: DC | PRN

## 2014-09-29 MED ORDER — BIVALIRUDIN BOLUS VIA INFUSION - CUPID
INTRAVENOUS | Status: DC | PRN
  Administered 2014-09-29: 101.85 mg via INTRAVENOUS

## 2014-09-29 MED ORDER — ALUM & MAG HYDROXIDE-SIMETH 200-200-20 MG/5ML PO SUSP
30.0000 mL | Freq: Four times a day (QID) | ORAL | Status: DC | PRN
  Administered 2014-09-29 (×2): 30 mL via ORAL
  Filled 2014-09-29 (×2): qty 30

## 2014-09-29 MED ORDER — MORPHINE SULFATE 4 MG/ML IJ SOLN
4.0000 mg | Freq: Once | INTRAMUSCULAR | Status: AC
  Administered 2014-09-29: 4 mg via INTRAVENOUS
  Filled 2014-09-29: qty 1

## 2014-09-29 MED ORDER — CLOPIDOGREL BISULFATE 300 MG PO TABS
ORAL_TABLET | ORAL | Status: AC
  Filled 2014-09-29: qty 2

## 2014-09-29 MED ORDER — HEPARIN SODIUM (PORCINE) 5000 UNIT/ML IJ SOLN
5000.0000 [IU] | Freq: Three times a day (TID) | INTRAMUSCULAR | Status: DC
  Administered 2014-09-29 – 2014-09-30 (×3): 5000 [IU] via SUBCUTANEOUS
  Filled 2014-09-29 (×5): qty 1

## 2014-09-29 MED ORDER — ANGIOPLASTY BOOK
Freq: Once | Status: DC
  Filled 2014-09-29: qty 1

## 2014-09-29 MED ORDER — ASPIRIN 325 MG PO TABS
325.0000 mg | ORAL_TABLET | Freq: Every day | ORAL | Status: DC
  Administered 2014-09-29: 325 mg via ORAL
  Filled 2014-09-29: qty 1

## 2014-09-29 MED ORDER — SODIUM CHLORIDE 0.9 % IV SOLN: INTRAVENOUS | Status: DC

## 2014-09-29 MED ORDER — SODIUM CHLORIDE 0.9 % IV SOLN: 1.7500 mg/kg/h | INTRAVENOUS | Status: DC

## 2014-09-29 MED ORDER — ONDANSETRON HCL 4 MG/2ML IJ SOLN
4.0000 mg | Freq: Once | INTRAMUSCULAR | Status: AC
  Administered 2014-09-29: 4 mg via INTRAVENOUS
  Filled 2014-09-29: qty 2

## 2014-09-29 MED ORDER — REGADENOSON 0.4 MG/5ML IV SOLN
INTRAVENOUS | Status: AC
  Filled 2014-09-29: qty 5

## 2014-09-29 MED ORDER — CLOPIDOGREL BISULFATE 75 MG PO TABS
ORAL_TABLET | ORAL | Status: DC | PRN
  Administered 2014-09-29: 600 mg via ORAL

## 2014-09-29 MED ORDER — HEPARIN (PORCINE) IN NACL 2-0.9 UNIT/ML-% IJ SOLN
INTRAMUSCULAR | Status: AC
  Filled 2014-09-29: qty 1000

## 2014-09-29 MED ORDER — NICOTINE 21 MG/24HR TD PT24
21.0000 mg | MEDICATED_PATCH | Freq: Every day | TRANSDERMAL | Status: DC
  Administered 2014-09-30: 21 mg via TRANSDERMAL
  Filled 2014-09-29 (×2): qty 1

## 2014-09-29 SURGICAL SUPPLY — 25 items
BALLN EUPHORA RX 2.0X15 (BALLOONS) ×2
BALLN ~~LOC~~ EUPHORA RX 3.5X20 (BALLOONS) ×2
BALLOON EUPHORA RX 2.0X15 (BALLOONS) IMPLANT
BALLOON ~~LOC~~ EUPHORA RX 3.5X20 (BALLOONS) IMPLANT
CATH INFINITI 5 FR 3DRC (CATHETERS) ×1 IMPLANT
CATH INFINITI 5 FR JL3.5 (CATHETERS) ×2 IMPLANT
CATH INFINITI 5FR AL1 (CATHETERS) ×1 IMPLANT
CATH INFINITI 5FR ANG PIGTAIL (CATHETERS) ×2 IMPLANT
CATH INFINITI 5FR MULTPACK ANG (CATHETERS) IMPLANT
CATH INFINITI JR4 5F (CATHETERS) ×2 IMPLANT
DEVICE RAD COMP TR BAND LRG (VASCULAR PRODUCTS) ×2 IMPLANT
GLIDESHEATH SLEND SS 6F .021 (SHEATH) ×2 IMPLANT
GUIDE CATH RUNWAY 6FR CLS3 (CATHETERS) ×1 IMPLANT
KIT ENCORE 26 ADVANTAGE (KITS) ×1 IMPLANT
KIT HEART LEFT (KITS) ×2 IMPLANT
PACK CARDIAC CATHETERIZATION (CUSTOM PROCEDURE TRAY) ×2 IMPLANT
SHEATH PINNACLE 5F 10CM (SHEATH) IMPLANT
STENT SYNERGY DES 3X38 (Permanent Stent) ×1 IMPLANT
SYR MEDRAD MARK V 150ML (SYRINGE) ×2 IMPLANT
TRANSDUCER W/STOPCOCK (MISCELLANEOUS) ×2 IMPLANT
TUBING CIL FLEX 10 FLL-RA (TUBING) ×2 IMPLANT
VALVE GUARDIAN II ~~LOC~~ HEMO (MISCELLANEOUS) ×1 IMPLANT
WIRE ASAHI PROWATER 180CM (WIRE) ×1 IMPLANT
WIRE EMERALD 3MM-J .035X150CM (WIRE) IMPLANT
WIRE SAFE-T 1.5MM-J .035X260CM (WIRE) ×2 IMPLANT

## 2014-09-29 NOTE — Progress Notes (Signed)
Patient arrived to unit from ED via stretcher. Patient alert, oriented and ambulatory. Admission weight, vitals and assessment completed. Fall and safety plan reviewed with patient. Patient currently resting comfortably, pain free, call light within reach. Will continue to monitor. Blood pressure 120/57, pulse 72, temperature 97.3 F (36.3 C), temperature source Oral, resp. rate 18, height 6\' 1"  (1.854 m), weight 135.762 kg (299 lb 4.8 oz), SpO2 99 %. Tresa Endo

## 2014-09-29 NOTE — Progress Notes (Signed)
Stress test requested from Dr. Blaine Hamper.  Reviewed data, ez negative x 2, will check a 3rd set at 9 am.  Have ordered Myoview, will try for exercise, may need Lexiscan.  [name], Hershal Coria 09/29/2014 7:17 AM Beeper [phone]

## 2014-09-29 NOTE — Progress Notes (Signed)
*  PRELIMINARY RESULTS* Echocardiogram 2D Echocardiogram has been performed.  Leavy Cella 09/29/2014, 11:49 AM

## 2014-09-29 NOTE — H&P (View-Only) (Signed)
CARDIOLOGY CONSULT NOTE   Patient ID: Frank Nguyen MRN: [id number] DOB/AGE: [birth year] 49 y.o.  Admit date: 09/28/2014  Primary Physician   McKeag, Marylynn Pearson, MD Primary Cardiologist   New Reason for Consultation   Chest pain  NAT:FTDD Frank Nguyen is a 49 y.o. year old male with a history of surgically-induced hypothyroid, OSA, HTN, HL and tobacco use. He was admitted with chest pain and cardiology was asked to evaluate him.  Frank Nguyen has had exertional chest pain for 8 days. He has been able to limit the symptoms by limiting his activity. When he gets the pain, he will stop what he is doing, and the pain will be relieved by rest in 5-10 minutes. Last pm, he had symptoms that started at rest, after dinner. The pain radiated up into his throat. The pain is tightness, 10/10. He was diaphoretic, but denies SOB or N&V. He did not try anything for the pain. Deep breathing helped briefly, but it did not decrease it for long.   He came to the ER, and noticed a decrease in his pain when he got there. It was like something released, but the pain did not go away and came back to a 6/10. Nitro did not seem to help, he also got ASA, Zofran and morphine. He doesn't think any of them helped. The pain decreased to a 1-2/10, but has not been a zero since admission. It will be increased by walking 100 ft. He has never had symptoms like this before.  He has varicose veins, not new. He has occasional sinus problems. No recent illnesses or injuries. He has not traveled recently, no immobilization. No bleeding issues. No GI symptoms except when he overeats.    Past Medical History  Diagnosis Date  . Tumor, thyroid 2010    No records seen, only self-reported   . Sleep apnea 2009    on CPAP  . HLD (hyperlipidemia)   . Hypothyroid   . Tobacco abuse   . HTN (hypertension)      Past Surgical History  Procedure Laterality Date  . Thyroidectomy  2010    at [facility] in [place]  . Tonsillectomy and  adenoidectomy      No Known Allergies  I have reviewed the patient's current medications . aspirin  325 mg Oral Daily  . atorvastatin  80 mg Oral q1800  . carvedilol  3.125 mg Oral BID WC  . heparin  5,000 Units Subcutaneous 3 times per day  . hydrochlorothiazide  25 mg Oral Daily  . levothyroxine  200 mcg Oral QAC breakfast  . nicotine  21 mg Transdermal Daily  . sodium chloride  3 mL Intravenous Q12H   . sodium chloride 100 mL/hr at 09/29/14 0115   acetaminophen **OR** acetaminophen, alum & mag hydroxide-simeth, morphine injection, nitroGLYCERIN, ondansetron  Prior to Admission medications   Medication Sig Start Date End Date Taking? Authorizing Provider  hydrochlorothiazide (HYDRODIURIL) 25 MG tablet Take 25 mg by mouth daily.   Yes Historical Provider, MD  levothyroxine (SYNTHROID, LEVOTHROID) 200 MCG tablet Take 1 tablet (200 mcg total) by mouth daily. 11/30/13 11/30/14 Yes Elberta Leatherwood, MD  simvastatin (ZOCOR) 40 MG tablet Take 1 tablet (40 mg total) by mouth at bedtime. 11/30/13  Yes Elberta Leatherwood, MD  traMADol (ULTRAM) 50 MG tablet Take 1 tablet (50 mg total) by mouth every 8 (eight) hours as needed for pain. Patient not taking: Reported on 09/29/2014 01/21/13   Percell Miller  Dagoberto Reef, MD     History   Social History  . Marital Status: Married    Spouse Name: N/A  . Number of Children: 1  . Years of Education: N/A   Occupational History  . Manual Labor - [employer]    Social History Main Topics  . Smoking status: Current Some Day Smoker -- 0.25 packs/day for 20 years    Types: Cigarettes  . Smokeless tobacco: Not on file  . Alcohol Use: No  . Drug Use: No  . Sexual Activity: Not on file   Other Topics Concern  . Not on file   Social History Narrative   Frank. Nguyen was recently released from prison in October 2012. He now lives with his wife and 32 year old daughter.       Works for [employer] in [place].     Family Status  Relation Status  Death Age  . Mother Alive     Born approx [phone]  . Father Alive     Born approx 1946   Family History  Problem Relation Age of Onset  . Diabetes Mother   . Diabetes Father   . Cancer Sister     Breast  . Diabetes Brother   . Alzheimer's disease      grandparents, aunts, uncles   . Heart failure Mother      ROS:  Full 14 point review of systems complete and found to be negative unless listed above.  Physical Exam: Blood pressure 108/58, pulse 74, temperature 97.6 F (36.4 C), temperature source Oral, resp. rate 20, height 6\' 1"  (1.854 m), weight 299 lb 4.8 oz (135.762 kg), SpO2 100 %.  General: Well developed, well nourished, male in no acute distress Head: Eyes PERRLA, No xanthomas.   Normocephalic and atraumatic, oropharynx without edema or exudate. Dentition: moderate Lungs: few rales bases Heart: HRRR S1 S2, no rub/gallop, no murmur. pulses are 2+ all 4 extrem.   Neck: No carotid bruits. No lymphadenopathy.  JVD not elevated. Abdomen: Bowel sounds present, abdomen soft and non-tender without masses or hernias noted. Msk:  No spine or cva tenderness. No weakness, no joint deformities or effusions. Extremities: No clubbing or cyanosis. No edema.  Neuro: Alert and oriented X 3. No focal deficits noted. Psych:  Good affect, responds appropriately Skin: No rashes or lesions noted.  Labs:   Lab Results  Component Value Date   WBC 7.8 09/29/2014   HGB 13.3 09/29/2014   HCT 40.7 09/29/2014   MCV 81.4 09/29/2014   PLT 267 09/29/2014    Recent Labs  09/29/14 0244  INR 1.03     Recent Labs Lab 09/29/14 0244  NA 136  K 4.0  CL 102  CO2 26  BUN 14  CREATININE 1.01  CALCIUM 9.0  PROT 6.9  BILITOT 0.5  ALKPHOS 104  ALT 26  AST 28  GLUCOSE 110*  ALBUMIN 3.4*   No results found for: MG  Recent Labs  09/29/14 0244 09/29/14 0631 09/29/14 1321  TROPONINI <0.03 <0.03 <0.03    Recent Labs  09/28/14 2114  TROPIPOC 0.00   B NATRIURETIC PEPTIDE    Date/Time Value Ref Range Status  09/28/2014 08:59 PM 9.7 0.0 - 100.0 pg/mL Final   Lab Results  Component Value Date   CHOL 143 09/29/2014   HDL 37* 09/29/2014   LDLCALC 88 09/29/2014   TRIG 89 09/29/2014    Echo: 09/29/2014 Conclusions - Left ventricle: Abnormal septal motion. The cavity size  was mildly dilated. Wall thickness was increased in a pattern of moderate LVH. Systolic function was mildly reduced. The estimated ejection fraction was in the range of 45% to 50%. Diffuse hypokinesis. - Left atrium: The atrium was mildly dilated.  ECG:  09/29/2014 SR, inferior Q waves in III  Radiology:  Dg Chest 2 View 09/28/2014   CLINICAL DATA:  Intermittent chest pain, palpitations and shortness of breath for 1 week, worse tonight. History of tobacco abuse.  EXAM: CHEST  2 VIEW  COMPARISON:  None.  FINDINGS: Cardiomediastinal silhouette is unremarkable. Mild bronchitic changes. The lungs are clear without pleural effusions or focal consolidations. Trachea projects midline and there is no pneumothorax. Soft tissue planes and included osseous structures are non-suspicious.  IMPRESSION: Mild bronchitic changes without focal consolidation.   Electronically Signed   By: Elon Alas   On: 09/28/2014 21:57    ASSESSMENT AND PLAN:   The patient was seen today by Dr. Haroldine Laws, the patient evaluated and the data reviewed.  Principal Problem:   Chest pain - has been having consistently with exertion - had during stress test - multiple CRFs including HTN, HL, Tob use, obesity - cath indicated. The risks and benefits of a cardiac catheterization including, but not limited to, death, stroke, MI, kidney damage and bleeding were discussed with the patient who indicates understanding and agrees to proceed.   Otherwise, per IM Active Problems:   Hypothyroidism, secondary   Tobacco abuse - cessation encouraged   Elevated LDL cholesterol level   Obesity (BMI 30-39.9)   Low back pain    OSA on CPAP   HTN (hypertension)   Signed: Lenoard Aden 09/29/2014 2:47 PM Beeper [phone]  Co-Sign MD  Patient seen and examined with [name], PA-C. We discussed all aspects of the encounter. I agree with the assessment and plan as stated above.   CP very concerning for [place]. Will plan cath today.   I have reviewed the risks, indications, and alternatives to angioplasty and stenting with the patient. Risks include but are not limited to bleeding, infection, vascular injury, stroke, myocardial infection, arrhythmia, kidney injury, radiation-related injury in the case of prolonged fluoroscopy use, emergency cardiac surgery, and death. The patient understands the risks of serious complication is low (<5%) and he agrees to proceed.   Hadlie Gipson,MD 3:07 PM

## 2014-09-29 NOTE — Progress Notes (Signed)
Pt attempted an exercise Myoview today. He was only able to go 5 minutes of the [place] before he had to stop secondary to increasing chest pain consistent with angina. Max HR was 122, less that 85% of APMHR. His symptoms improved in recovery after NTG x 1. No diagnostic EKG changes with stress. Test was aborted, he was not injected.   Kerin Ransom PA-C 09/29/2014 4:02 PM

## 2014-09-29 NOTE — ED Notes (Signed)
Admitting MD at bedside.

## 2014-09-29 NOTE — Progress Notes (Signed)
   [facility]                                                                              Patient Demographics  Frank Nguyen, is a 49 y.o. male, DOB - Dec 21, 1965, VFI:[id number]  Admit date - 09/28/2014   Admitting Physician Ivor Costa, MD  Outpatient Primary MD for the patient is McKeag, Marylynn Pearson, MD  LOS - 0   Chief Complaint  Patient presents with  . Chest Pain      HPI on 09/29/2014 by Dr. Ivor Costa Frank Nguyen is a 49 y.o. 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 Patient reports that he has been having intermittent chest pain in the past 7 days. It happens 3-5 times each day. It is located in the left side of his chest, 10 out of 10 in severity when it is on, pounding-like pain, radiating to the left neck and throat. It is aggravated by exertion. It is associated with shortness of breath, sweating. It is not pleuritic chest pain. He does not have tenderness over calf areas. No recent long distance traveling history. His chest pain has been worsened yesterday. Currently patient denies fever, chills, running nose, ear pain, headaches, cough, abdominal pain, diarrhea, constipation, dysuria, urgency, frequency, hematuria, skin rashes, joint pain or leg swelling. No unilateral weakness, numbness or tingling sensations. No vision change or hearing loss. In ED, patient was found to have active troponin, BNP 9.7, WBC 8.9, temperature normal, tachycardia, electrolytes okay. Chest x-ray showed mild chronic Change. Patient is admitted to inpatient for further evaluation and treatment. EKG showed Q-wave in lead III only.  Assessment & Plan   Patient admitted earlier this morning by Dr. Ivor Costa.  Please see full H&P. Agree with current assessment and plan.  Chest pain, exertional -Troponins cycled, currently negative -Patient does have several risk factors including hypertension  tobacco abuse and hyperlipidemia -Continue morphine for pain, aspirin, Coreg -Patient currently placed on Lipitor, was on Zocor at home -Lipid panel: Total cholesterol 143, triglycerides 89, HDL 37, LDL 88 -Echocardiogram EF 45-50%, diffuse hypokinesis -Cardiology consultant appreciated, possible stress test today  Hypothyroidism -TSH in July 2015 0.462 -Continue Synthroid  Tobacco abuse and alcohol abuse -Patient counseled -Continue nicotine patch  Hypertension -Continue core hydrochlorothiazide  Obstructive sleep apnea -Continue CPAP  Hyperlipidemia -Lipid panel: Total cholesterol 143, triglycerides 89, HDL 37, LDL 88 -Continue statin  Code Status: Full  Family Communication: Wife at bedside  Disposition Plan: Admitted, pending further cardiac monitoring  Time Spent in minutes   30 minutes  Procedures  Echocardiogram  Consults   Cardiology  DVT Prophylaxis  heparin  Terriona Horlacher D.O. on 09/29/2014 at 1:09 PM  Between 7am to 7pm - Pager - [phone]  After 7pm go to [email or web address] - password [email or web address]  And look for the night coverage person covering for me after hours  [facility] Office  [phone]

## 2014-09-29 NOTE — Progress Notes (Signed)
Per NM RN, patient may be having cardiac cath this afternoon.  Patient requesting to eat if cath won't be this afternoon.  [name], NP at bedside regarding cath.  Will await further orders and continue to monitor.

## 2014-09-29 NOTE — Consult Note (Signed)
CARDIOLOGY CONSULT NOTE   Patient ID: Frank Nguyen MRN: [id number] DOB/AGE: January 03, 1966 49 y.o.  Admit date: 09/28/2014  Primary Physician   McKeag, Marylynn Pearson, MD Primary Cardiologist   New Reason for Consultation   Chest pain  Frank Nguyen is a 49 y.o. year old male with a history of surgically-induced hypothyroid, OSA, HTN, HL and tobacco use. He was admitted with chest pain and cardiology was asked to evaluate him.  Frank Nguyen has had exertional chest pain for 8 days. He has been able to limit the symptoms by limiting his activity. When he gets the pain, he will stop what he is doing, and the pain will be relieved by rest in 5-10 minutes. Last pm, he had symptoms that started at rest, after dinner. The pain radiated up into his throat. The pain is tightness, 10/10. He was diaphoretic, but denies SOB or N&V. He did not try anything for the pain. Deep breathing helped briefly, but it did not decrease it for long.   He came to the ER, and noticed a decrease in his pain when he got there. It was like something released, but the pain did not go away and came back to a 6/10. Nitro did not seem to help, he also got ASA, Zofran and morphine. He doesn't think any of them helped. The pain decreased to a 1-2/10, but has not been a zero since admission. It will be increased by walking 100 ft. He has never had symptoms like this before.  He has varicose veins, not new. He has occasional sinus problems. No recent illnesses or injuries. He has not traveled recently, no immobilization. No bleeding issues. No GI symptoms except when he overeats.    Past Medical History  Diagnosis Date  . Tumor, thyroid 2010    No records seen, only self-reported   . Sleep apnea 2009    on CPAP  . HLD (hyperlipidemia)   . Hypothyroid   . Tobacco abuse   . HTN (hypertension)      Past Surgical History  Procedure Laterality Date  . Thyroidectomy  2010    at [facility] in [place]  . Tonsillectomy and  adenoidectomy      No Known Allergies  I have reviewed the patient's current medications . aspirin  325 mg Oral Daily  . atorvastatin  80 mg Oral q1800  . carvedilol  3.125 mg Oral BID WC  . heparin  5,000 Units Subcutaneous 3 times per day  . hydrochlorothiazide  25 mg Oral Daily  . levothyroxine  200 mcg Oral QAC breakfast  . nicotine  21 mg Transdermal Daily  . sodium chloride  3 mL Intravenous Q12H   . sodium chloride 100 mL/hr at 09/29/14 0115   acetaminophen **OR** acetaminophen, alum & mag hydroxide-simeth, morphine injection, nitroGLYCERIN, ondansetron  Prior to Admission medications   Medication Sig Start Date End Date Taking? Authorizing Provider  hydrochlorothiazide (HYDRODIURIL) 25 MG tablet Take 25 mg by mouth daily.   Yes Historical Provider, MD  levothyroxine (SYNTHROID, LEVOTHROID) 200 MCG tablet Take 1 tablet (200 mcg total) by mouth daily. 11/30/13 11/30/14 Yes Elberta Leatherwood, MD  simvastatin (ZOCOR) 40 MG tablet Take 1 tablet (40 mg total) by mouth at bedtime. 11/30/13  Yes Elberta Leatherwood, MD  traMADol (ULTRAM) 50 MG tablet Take 1 tablet (50 mg total) by mouth every 8 (eight) hours as needed for pain. Patient not taking: Reported on 09/29/2014 01/21/13   Percell Miller  Dagoberto Reef, MD     History   Social History  . Marital Status: Married    Spouse Name: N/A  . Number of Children: 1  . Years of Education: N/A   Occupational History  . Manual Labor - [employer]    Social History Main Topics  . Smoking status: Current Some Day Smoker -- 0.25 packs/day for 20 years    Types: Cigarettes  . Smokeless tobacco: Not on file  . Alcohol Use: No  . Drug Use: No  . Sexual Activity: Not on file   Other Topics Concern  . Not on file   Social History Narrative   Frank Nguyen was recently released from prison in October 2012. He now lives with his wife and 35 year old daughter.       Works for [employer] in [place].     Family Status  Relation Status  Death Age  . Mother Alive     Born approx [phone]  . Father Alive     Born approx 1946   Family History  Problem Relation Age of Onset  . Diabetes Mother   . Diabetes Father   . Cancer Sister     Breast  . Diabetes Brother   . Alzheimer's disease      grandparents, aunts, uncles   . Heart failure Mother      ROS:  Full 14 point review of systems complete and found to be negative unless listed above.  Physical Exam: Blood pressure 108/58, pulse 74, temperature 97.6 F (36.4 C), temperature source Oral, resp. rate 20, height 6\' 1"  (1.854 m), weight 299 lb 4.8 oz (135.762 kg), SpO2 100 %.  General: Well developed, well nourished, male in no acute distress Head: Eyes PERRLA, No xanthomas.   Normocephalic and atraumatic, oropharynx without edema or exudate. Dentition: moderate Lungs: few rales bases Heart: HRRR S1 S2, no rub/gallop, no murmur. pulses are 2+ all 4 extrem.   Neck: No carotid bruits. No lymphadenopathy.  JVD not elevated. Abdomen: Bowel sounds present, abdomen soft and non-tender without masses or hernias noted. Msk:  No spine or cva tenderness. No weakness, no joint deformities or effusions. Extremities: No clubbing or cyanosis. No edema.  Neuro: Alert and oriented X 3. No focal deficits noted. Psych:  Good affect, responds appropriately Skin: No rashes or lesions noted.  Labs:   Lab Results  Component Value Date   WBC 7.8 09/29/2014   HGB 13.3 09/29/2014   HCT 40.7 09/29/2014   MCV 81.4 09/29/2014   PLT 267 09/29/2014    Recent Labs  09/29/14 0244  INR 1.03     Recent Labs Lab 09/29/14 0244  NA 136  K 4.0  CL 102  CO2 26  BUN 14  CREATININE 1.01  CALCIUM 9.0  PROT 6.9  BILITOT 0.5  ALKPHOS 104  ALT 26  AST 28  GLUCOSE 110*  ALBUMIN 3.4*   No results found for: MG  Recent Labs  09/29/14 0244 09/29/14 0631 09/29/14 1321  TROPONINI <0.03 <0.03 <0.03    Recent Labs  09/28/14 2114  TROPIPOC 0.00   B NATRIURETIC PEPTIDE    Date/Time Value Ref Range Status  09/28/2014 08:59 PM 9.7 0.0 - 100.0 pg/mL Final   Lab Results  Component Value Date   CHOL 143 09/29/2014   HDL 37* 09/29/2014   LDLCALC 88 09/29/2014   TRIG 89 09/29/2014    Echo: 09/29/2014 Conclusions - Left ventricle: Abnormal septal motion. The cavity size  was mildly dilated. Wall thickness was increased in a pattern of moderate LVH. Systolic function was mildly reduced. The estimated ejection fraction was in the range of 45% to 50%. Diffuse hypokinesis. - Left atrium: The atrium was mildly dilated.  ECG:  09/29/2014 SR, inferior Q waves in III  Radiology:  Dg Chest 2 View 09/28/2014   CLINICAL DATA:  Intermittent chest pain, palpitations and shortness of breath for 1 week, worse tonight. History of tobacco abuse.  EXAM: CHEST  2 VIEW  COMPARISON:  None.  FINDINGS: Cardiomediastinal silhouette is unremarkable. Mild bronchitic changes. The lungs are clear without pleural effusions or focal consolidations. Trachea projects midline and there is no pneumothorax. Soft tissue planes and included osseous structures are non-suspicious.  IMPRESSION: Mild bronchitic changes without focal consolidation.   Electronically Signed   By: Elon Alas   On: 09/28/2014 21:57    ASSESSMENT AND PLAN:   The patient was seen today by Dr. Haroldine Laws, the patient evaluated and the data reviewed.  Principal Problem:   Chest pain - has been having consistently with exertion - had during stress test - multiple CRFs including HTN, HL, Tob use, obesity - cath indicated. The risks and benefits of a cardiac catheterization including, but not limited to, death, stroke, MI, kidney damage and bleeding were discussed with the patient who indicates understanding and agrees to proceed.   Otherwise, per IM Active Problems:   Hypothyroidism, secondary   Tobacco abuse - cessation encouraged   Elevated LDL cholesterol level   Obesity (BMI 30-39.9)   Low back pain    OSA on CPAP   HTN (hypertension)   Signed: Lenoard Aden 09/29/2014 2:47 PM Beeper [phone]  Co-Sign MD  Patient seen and examined with [name], PA-C. We discussed all aspects of the encounter. I agree with the assessment and plan as stated above.   CP very concerning for [place]. Will plan cath today.   I have reviewed the risks, indications, and alternatives to angioplasty and stenting with the patient. Risks include but are not limited to bleeding, infection, vascular injury, stroke, myocardial infection, arrhythmia, kidney injury, radiation-related injury in the case of prolonged fluoroscopy use, emergency cardiac surgery, and death. The patient understands the risks of serious complication is low (<5%) and he agrees to proceed.   Agostino Gorin,MD 3:07 PM

## 2014-09-29 NOTE — Progress Notes (Signed)
CPAP machine setup for patient to wear tonight. Patient stated he was not ready to go on at this time and would call when ready. Water chamber filled. RN at bedside.

## 2014-09-29 NOTE — H&P (Signed)
[facility] History and Physical  IZEK CORVINO ZOX:[id number] DOB: [birth year] DOA: 09/28/2014  Referring physician: ED physician PCP: Elberta Leatherwood, MD  Specialists:   Chief Complaint: chest pain  HPI: Frank Nguyen is a 49 y.o. 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  Patient reports that he has been having intermittent chest pain in the past 7 days. It happens 3-5 times each day. It is located in the left side of his chest, 10 out of 10 in severity when it is on, pounding-like pain, radiating to the left neck and throat. It is aggravated by exertion. It is associated with shortness of breath, sweating. It is not pleuritic chest pain. He does not have tenderness over calf areas. No recent long distance traveling history. His chest pain has been worsened yesterday.  Currently patient denies fever, chills, running nose, ear pain, headaches, cough, abdominal pain, diarrhea, constipation, dysuria, urgency, frequency, hematuria, skin rashes, joint pain or leg swelling. No unilateral weakness, numbness or tingling sensations. No vision change or hearing loss.  In ED, patient was found to have active troponin, BNP 9.7, WBC 8.9, temperature normal, tachycardia, electrolytes okay. Chest x-ray showed mild chronic Change. Patient is admitted to inpatient for further evaluation and treatment. EKG showed Q-wave in lead III only.  Where does patient live?   At home    Can patient participate in ADLs?  Yes     Review of Systems:   General: no fevers, chills, no changes in body weight, normal appetite, has fatigue HEENT: no blurry vision, hearing changes or sore throat Pulm: has dyspnea, No coughing, wheezing CV: has chest pain, no palpitations Abd: no nausea, vomiting, abdominal pain, diarrhea, constipation GU: no dysuria, burning on urination, increased urinary frequency, hematuria   Ext: no leg edema Neuro: no unilateral weakness, numbness, or tingling, no vision change or hearing loss Skin: no rash MSK: No muscle spasm, no deformity, no limitation of range of movement in spin Heme: No easy bruising.  Travel history: No recent long distant travel.  Allergy: No Known Allergies  Past Medical History  Diagnosis Date  . Tumor, thyroid 2010    No records seen, only self-reported   . Sleep apnea 2009  . HLD (hyperlipidemia)   . Hypothyroid   . Tobacco abuse   . HTN (hypertension)     Past Surgical History  Procedure Laterality Date  . Thyroidectomy  2010    Social History:  reports that he has been smoking Cigarettes.  He has a 5 pack-year smoking history. He does not have any smokeless tobacco history on file. He reports that he does not drink alcohol or use illicit drugs.  Family History:  Family History  Problem Relation Age of Onset  . Diabetes Mother   . Diabetes Father   . Cancer Sister     Breast  . Diabetes Brother   . Alzheimer's disease      grandparents, aunts, uncles      Prior to Admission medications   Medication Sig Start Date End Date Taking? Authorizing Provider  levothyroxine (SYNTHROID, LEVOTHROID) 200 MCG tablet Take 1 tablet (200 mcg total) by mouth daily. 11/30/13 11/30/14  Elberta Leatherwood, MD  simvastatin (ZOCOR) 40 MG tablet Take 1 tablet (40 mg total) by mouth at bedtime. 11/30/13   Elberta Leatherwood, MD  traMADol (ULTRAM) 50 MG tablet Take 1 tablet (50 mg total) by mouth  every 8 (eight) hours as needed for pain. 01/21/13   Angelica Ran, MD    Physical Exam: Filed Vitals:   09/29/14 0045 09/29/14 0100 09/29/14 0140 09/29/14 0325  BP: 122/65 115/82 120/57   Pulse: 81 66 70 72  Temp:   97.3 F (36.3 C)   TempSrc:   Oral   Resp: 16 15 18 18   Height:   6\' 1"  (1.854 m)   Weight:   135.762 kg (299 lb 4.8 oz)   SpO2: 99% 100% 100% 99%   General: Not in acute distress HEENT:       Eyes: PERRL, EOMI, no scleral icterus.        ENT: No discharge from the ears and nose, no pharynx injection, no tonsillar enlargement.        Neck: No JVD, no bruit, no mass felt. Heme: No neck lymph node enlargement. Cardiac: S1/S2, RRR, No murmurs, No gallops or rubs. Pulm: Good air movement bilaterally. No rales, wheezing, rhonchi or rubs. Abd: Soft, nondistended, nontender, no rebound pain, no organomegaly, BS present. Ext: No edema bilaterally. 2+DP/PT pulse bilaterally. Musculoskeletal: No joint deformities, No joint redness or warmth, no limitation of ROM in spin. Skin: No rashes.  Neuro: Alert, oriented X3, cranial nerves II-XII grossly intact, muscle strength 5/5 in all extremities, sensation to light touch intact.  Psych: Patient is not psychotic,  no suicidal or hemocidal ideation.  Labs on Admission:  Basic Metabolic Panel:  Recent Labs Lab 09/28/14 2059 09/29/14 0244  NA 138 136  K 3.9 4.0  CL 103 102  CO2 24 26  GLUCOSE 104* 110*  BUN 15 14  CREATININE 1.18 1.01  CALCIUM 9.6 9.0   Liver Function Tests:  Recent Labs Lab 09/29/14 0244  AST 28  ALT 26  ALKPHOS 104  BILITOT 0.5  PROT 6.9  ALBUMIN 3.4*   No results for input(s): LIPASE, AMYLASE in the last 168 hours. No results for input(s): AMMONIA in the last 168 hours. CBC:  Recent Labs Lab 09/28/14 2059 09/29/14 0244  WBC 8.9 7.8  HGB 13.9 13.3  HCT 41.6 40.7  MCV 80.8 81.4  PLT 294 267   Cardiac Enzymes:  Recent Labs Lab 09/29/14 0244  TROPONINI <0.03    BNP (last 3 results)  Recent Labs  09/28/14 2059  BNP 9.7    ProBNP (last 3 results) No results for input(s): PROBNP in the last 8760 hours.  CBG:  Recent Labs Lab 09/29/14 0542  GLUCAP 120*    Radiological Exams on Admission: Dg Chest 2 View  09/28/2014   CLINICAL DATA:  Intermittent chest pain, palpitations and shortness of breath for 1 week, worse tonight. History of tobacco abuse.  EXAM: CHEST  2 VIEW  COMPARISON:  None.  FINDINGS: Cardiomediastinal silhouette  is unremarkable. Mild bronchitic changes. The lungs are clear without pleural effusions or focal consolidations. Trachea projects midline and there is no pneumothorax. Soft tissue planes and included osseous structures are non-suspicious.  IMPRESSION: Mild bronchitic changes without focal consolidation.   Electronically Signed   By: Elon Alas   On: 09/28/2014 21:57    EKG: Independently reviewed.  Abnormal findings:  q Wave in III Assessment/Plan Principal Problem:   Chest pain Active Problems:   Hypothyroidism, secondary   Tobacco abuse   Elevated LDL cholesterol level   Obesity (BMI 30-39.9)   Low back pain   OSA on CPAP   HTN (hypertension)  Chest pain: Has exertional chest pain. He has risk factors  of hypothyroidism, HTN, tobaccol abuse and hyperlipidemia, it is important to rule out ACS. There is no infiltration on chest x-ray. Pulmonary embolism is less likely given no any risk factors. Called card PA in AM, will see in AM. - will admit to Tele bed  - cycle CE q6 x3 and repeat her EKG in the am  - Nitroglycerin, Morphine, and aspirin, coreg - will switch Zocor to high dose Lipitor  - Risk factor stratification: will check FLP and A1C  - follow up Card's recommendations - 2d echo  Hypothyroidism: Previously TSH was 0.462 on 12/01/13 -continue Synthroid.  Tobacco abuse and Alcohol abuse: -Did counseling about importance of quitting smoking -Nicotine patch  OSA: -CPAP  HTN:  -Coreg, HCTZ  DVT ppx: SQ Heparin      Code Status: Full code Family Communication: None at bed side. Disposition Plan: Admit to inpatient   Date of Service 09/29/2014    Ivor Costa [facility] Pager [phone]  If 7PM-7AM, please contact night-coverage [email or web address] Password [email or web address] 09/29/2014, 7:07 AM

## 2014-09-29 NOTE — Interval H&P Note (Signed)
Cath Lab Visit (complete for each Cath Lab visit)  Clinical Evaluation Leading to the Procedure:   ACS: Yes.    Non-ACS:    Anginal Classification: CCS IV  Anti-ischemic medical therapy: Minimal Therapy (1 class of medications)  Non-Invasive Test Results: Equivocal test results  Unable to complete test  Prior CABG: No previous CABG  TIMI SCORE  Patient Information:  TIMI Score is 2  UA/NSTEMI and low-risk features (e.g., TIMI score  Revascularization of the presumed culprit artery   U (6)  Indication: 9; Score: 6     History and Physical Interval Note:  09/29/2014 5:26 PM  Frank Nguyen  has presented today for surgery, with the diagnosis of cp  The various methods of treatment have been discussed with the patient and family. After consideration of risks, benefits and other options for treatment, the patient has consented to  Procedure(Nguyen): Left Heart Cath and Coronary Angiography (N/A) as a surgical intervention .  The patient'Nguyen history has been reviewed, patient examined, no change in status, stable for surgery.  I have reviewed the patient'Nguyen chart and labs.  Questions were answered to the patient'Nguyen satisfaction.     Frank Nguyen.

## 2014-09-30 ENCOUNTER — Ambulatory Visit (HOSPITAL_COMMUNITY): Payer: Non-veteran care

## 2014-09-30 DIAGNOSIS — I200 Unstable angina: Secondary | ICD-10-CM

## 2014-09-30 DIAGNOSIS — E669 Obesity, unspecified: Secondary | ICD-10-CM

## 2014-09-30 LAB — CBC
HCT: 40.6 % (ref 39.0–52.0)
Hemoglobin: 13.5 g/dL (ref 13.0–17.0)
MCH: 27.3 pg (ref 26.0–34.0)
MCHC: 33.3 g/dL (ref 30.0–36.0)
MCV: 82 fL (ref 78.0–100.0)
Platelets: 262 10*3/uL (ref 150–400)
RBC: 4.95 MIL/uL (ref 4.22–5.81)
RDW: 13.9 % (ref 11.5–15.5)
WBC: 6.6 10*3/uL (ref 4.0–10.5)

## 2014-09-30 LAB — HEMOGLOBIN A1C
HEMOGLOBIN A1C: 6.4 % — AB (ref 4.8–5.6)
Mean Plasma Glucose: 137 mg/dL

## 2014-09-30 LAB — BASIC METABOLIC PANEL
Anion gap: 9 (ref 5–15)
BUN: 10 mg/dL (ref 6–20)
CO2: 25 mmol/L (ref 22–32)
Calcium: 8.7 mg/dL — ABNORMAL LOW (ref 8.9–10.3)
Chloride: 103 mmol/L (ref 101–111)
Creatinine, Ser: 1.07 mg/dL (ref 0.61–1.24)
GFR calc Af Amer: >60 mL/min (ref >60)
Glucose, Bld: 126 mg/dL — ABNORMAL HIGH (ref 65–99)
Potassium: 4.2 mmol/L (ref 3.5–5.1)
SODIUM: 137 mmol/L (ref 135–145)

## 2014-09-30 MED ORDER — ASPIRIN 81 MG PO CHEW: 81.0000 mg | CHEWABLE_TABLET | Freq: Every day | ORAL | Status: AC

## 2014-09-30 MED ORDER — CARVEDILOL 3.125 MG PO TABS: 3.1250 mg | ORAL_TABLET | Freq: Two times a day (BID) | ORAL | Status: AC

## 2014-09-30 MED ORDER — NITROGLYCERIN 0.4 MG SL SUBL: 0.4000 mg | SUBLINGUAL_TABLET | SUBLINGUAL | Status: AC | PRN

## 2014-09-30 MED ORDER — ATORVASTATIN CALCIUM 80 MG PO TABS: 80.0000 mg | ORAL_TABLET | Freq: Every day | ORAL | Status: AC

## 2014-09-30 MED ORDER — NICOTINE 21 MG/24HR TD PT24: 21.0000 mg | MEDICATED_PATCH | Freq: Every day | TRANSDERMAL | Status: DC

## 2014-09-30 MED ORDER — CLOPIDOGREL BISULFATE 75 MG PO TABS: 75.0000 mg | ORAL_TABLET | Freq: Every day | ORAL | Status: AC

## 2014-09-30 NOTE — Discharge Summary (Signed)
Physician Discharge Summary  Frank Nguyen RCV:[id number] DOB:1965/07/18 DOA: 09/28/2014  PCP: Elberta Leatherwood, MD  Admit date: 09/28/2014 Discharge date: 09/30/2014  Time spent: 45 minutes  Recommendations for Outpatient Follow-up:  Patient will be discharged to home.  Patient will need to follow up with primary care provider within one week of discharge.  He will also need to follow up with cardiology, this office will arrange.  Patient should continue medications as prescribed.  Patient should follow a heart health diet.  Patient advised to stop smoking.  May continue activity as tolerated.   Discharge Diagnoses:  Chest pain/CAD Hypothyroidism Tobacco abuse and alcohol abuse Hypertension Obstructive sleep apnea Hyperlipidemia   Discharge Condition: Stable  Diet recommendation: Heart healthy  Filed Weights   09/29/14 0140 09/30/14 0006  Weight: 135.762 kg (299 lb 4.8 oz) 136.4 kg (300 lb 11.3 oz)    History of present illness:  on 09/29/2014 by Dr. Lenny Frank Nguyen is a 49 y.o. 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 Patient reports that he has been having intermittent chest pain in the past 7 days. It happens 3-5 times each day. It is located in the left side of his chest, 10 out of 10 in severity when it is on, pounding-like pain, radiating to the left neck and throat. It is aggravated by exertion. It is associated with shortness of breath, sweating. It is not pleuritic chest pain. He does not have tenderness over calf areas. No recent long distance traveling history. His chest pain has been worsened yesterday. Currently patient denies fever, chills, running nose, ear pain, headaches, cough, abdominal pain, diarrhea, constipation, dysuria, urgency, frequency, hematuria, skin rashes, joint pain or leg swelling. No unilateral weakness, numbness or tingling  sensations. No vision change or hearing loss. In ED, patient was found to have active troponin, BNP 9.7, WBC 8.9, temperature normal, tachycardia, electrolytes okay. Chest x-ray showed mild chronic Change. Patient is admitted to inpatient for further evaluation and treatment. EKG showed Q-wave in lead III only.  Hospital Course:  Chest pain, exertional/ CAD -Troponins cycled, currently negative -Patient does have several risk factors including hypertension tobacco abuse and hyperlipidemia -Continue morphine for pain, aspirin, Coreg -Patient currently placed on Lipitor, was on Zocor at home -Lipid panel: Total cholesterol 143, triglycerides 89, HDL 37, LDL 88 -Echocardiogram EF 45-50%, diffuse hypokinesis -Cardiology consultant appreciated -Stress test attempted -S/p Left heart catheterization and coronary angiography, had drug-eluting stent placed- mid LAD lesion, 95% stenosed -Patient placed on plavix and aspirin- will need to take for at least a year -Patient strongly advised to stop smoking -Encouraged cardiac rehab  Hypothyroidism -TSH in July 2015 0.462 -Continue Synthroid  Tobacco abuse and alcohol abuse -Patient counseled -Continue nicotine patch  Hypertension -Continue coreg hydrochlorothiazide  Obstructive sleep apnea -Continue CPAP  Hyperlipidemia -Lipid panel: Total cholesterol 143, triglycerides 89, HDL 37, LDL 88 -Continue statin  Procedures  Echocardiogram Stress test, attempted Left heart catheterization and coronary angiography  Consults  Cardiology  Discharge Exam: Filed Vitals:   09/30/14 0804  BP: 114/53  Pulse: 71  Temp: 98.3 F (36.8 C)  Resp: 18     General: Well developed, well nourished, no distress  HEENT: NCAT, mucous membranes moist.  Cardiovascular: S1 S2 auscultated, no rubs, murmurs or gallops. Regular rate and rhythm.  Respiratory: Clear to auscultation bilaterally with equal chest rise  Abdomen: Soft, nontender,  nondistended, +  bowel sounds  Extremities: warm dry without cyanosis clubbing or edema  Neuro: AAOx3, nonfocal  Psych: Normal affect and demeanor   Discharge Instructions      Discharge Instructions    Discharge instructions    Complete by:  As directed   Patient will be discharged to home.  Patient will need to follow up with primary care provider within one week of discharge.  He will also need to follow up with cardiology, this office will arrange.  Patient should continue medications as prescribed.  Patient should follow a heart health diet.  Patient advised to stop smoking.  May continue activity as tolerated.            Medication List    STOP taking these medications        simvastatin 40 MG tablet  Commonly known as:  ZOCOR     traMADol 50 MG tablet  Commonly known as:  ULTRAM      TAKE these medications        aspirin 81 MG chewable tablet  Chew 1 tablet (81 mg total) by mouth daily.     atorvastatin 80 MG tablet  Commonly known as:  LIPITOR  Take 1 tablet (80 mg total) by mouth daily at 6 PM.     carvedilol 3.125 MG tablet  Commonly known as:  COREG  Take 1 tablet (3.125 mg total) by mouth 2 (two) times daily with a meal.     clopidogrel 75 MG tablet  Commonly known as:  PLAVIX  Take 1 tablet (75 mg total) by mouth daily with breakfast.     hydrochlorothiazide 25 MG tablet  Commonly known as:  HYDRODIURIL  Take 25 mg by mouth daily.     levothyroxine 200 MCG tablet  Commonly known as:  SYNTHROID, LEVOTHROID  Take 1 tablet (200 mcg total) by mouth daily.     nicotine 21 mg/24hr patch  Commonly known as:  NICODERM CQ - dosed in mg/24 hours  Place 1 patch (21 mg total) onto the skin daily.     nitroGLYCERIN 0.4 MG SL tablet  Commonly known as:  NITROSTAT  Place 1 tablet (0.4 mg total) under the tongue every 5 (five) minutes as needed for chest pain.       No Known Allergies Follow-up Information    Follow up with McKeag, Marylynn Pearson, MD. Schedule  an appointment as soon as possible for a visit in 1 week.   Specialty:  Family Medicine   Why:  Hospital follow up   Contact information:   [address] [address] [phone]       Follow up with Jettie Booze., MD.   Specialties:  Cardiology, Radiology, Interventional Cardiology   Why:  office will contact you   Contact information:   [address] [place] [address] [phone]        The results of significant diagnostics from this hospitalization (including imaging, microbiology, ancillary and laboratory) are listed below for reference.    Significant Diagnostic Studies: Dg Chest 2 View  09/28/2014   CLINICAL DATA:  Intermittent chest pain, palpitations and shortness of breath for 1 week, worse tonight. History of tobacco abuse.  EXAM: CHEST  2 VIEW  COMPARISON:  None.  FINDINGS: Cardiomediastinal silhouette is unremarkable. Mild bronchitic changes. The lungs are clear without pleural effusions or focal consolidations. Trachea projects midline and there is no pneumothorax. Soft tissue planes and included osseous structures are non-suspicious.  IMPRESSION: Mild  bronchitic changes without focal consolidation.   Electronically Signed   By: Elon Alas   On: 09/28/2014 21:57    Microbiology: No results found for this or any previous visit (from the past 240 hour(s)).   Labs: Basic Metabolic Panel:  Recent Labs Lab 09/28/14 2059 09/29/14 0244 09/30/14 0253  NA 138 136 137  K 3.9 4.0 4.2  CL 103 102 103  CO2 24 26 25   GLUCOSE 104* 110* 126*  BUN 15 14 10   CREATININE 1.18 1.01 1.07  CALCIUM 9.6 9.0 8.7*   Liver Function Tests:  Recent Labs Lab 09/29/14 0244  AST 28  ALT 26  ALKPHOS 104  BILITOT 0.5  PROT 6.9  ALBUMIN 3.4*   No results for input(s): LIPASE, AMYLASE in the last 168 hours. No results for input(s): AMMONIA in the last 168 hours. CBC:  Recent Labs Lab 09/28/14 2059 09/29/14 0244  09/30/14 0253  WBC 8.9 7.8 6.6  HGB 13.9 13.3 13.5  HCT 41.6 40.7 40.6  MCV 80.8 81.4 82.0  PLT 294 267 262   Cardiac Enzymes:  Recent Labs Lab 09/29/14 0244 09/29/14 0631 09/29/14 1321  TROPONINI <0.03 <0.03 <0.03   BNP: BNP (last 3 results)  Recent Labs  09/28/14 2059  BNP 9.7    ProBNP (last 3 results) No results for input(s): PROBNP in the last 8760 hours.  CBG:  Recent Labs Lab 09/29/14 0542  GLUCAP 120*       Signed:  Cristal Ford  [facility] 09/30/2014, 10:57 AM

## 2014-09-30 NOTE — Progress Notes (Signed)
CARDIAC REHAB PHASE I   PRE:  Rate/Rhythm: 77 SR  BP:  Supine:   Sitting: 114/53  Standing:    SaO2: 97 RA  MODE:  Ambulation: 600 ft   POST:  Rate/Rhythm: 86 SR  BP:  Supine:   Sitting: 146/58  Standing:    SaO2: 99 RA Tolerated ambulation well without angina or difficulty.  Education completed re: cath site care and restrictions, nutrition tips, angina symptoms, NTG usage, activity and exercise progression, and lengthy discussion re: smoking cessation tactics, given "Better Quit" cigarette.  Discussed key points with patient's wife on the phone and went back to meet wife in person to answer additional questions.   [id number]  Liliane Channel RN, BSN 09/30/2014 9:02 AM

## 2014-09-30 NOTE — Discharge Instructions (Signed)
Coronary Angiogram With Stent, Care After °Refer to this sheet in the next few weeks. These instructions provide you with information on caring for yourself after your procedure. Your health care provider may also give you more specific instructions. Your treatment has been planned according to current medical practices, but problems sometimes occur. Call your health care provider if you have any problems or questions after your procedure.  °WHAT TO EXPECT AFTER THE PROCEDURE  °The insertion site may be tender for a few days after your procedure. °HOME CARE INSTRUCTIONS  °· Take medicines only as directed by your health care provider. Blood thinners may be prescribed after your procedure to improve blood flow through the stent. °· Change any bandages (dressings) as directed by your health care provider.   °· Check your insertion site every day for redness, swelling, or fluid leaking from the insertion.   °· Do not take baths, swim, or use a hot tub until your health care provider approves. You may shower. Pat the insertion area dry. Do not rub the insertion area with a washcloth or towel.   °· Eat a heart-healthy diet. This should include plenty of fresh fruits and vegetables. Meat should be lean cuts. Avoid the following types of food:   °¨ Food that is high in salt.   °¨ Canned or highly processed food.   °¨ Food that is high in saturated fat or sugar.   °¨ Fried food.   °· Make any other lifestyle changes recommended by your health care provider. This may include:   °¨ Not using any tobacco products including cigarettes, chewing tobacco, or electronic cigarettes.  °¨ Managing your weight.   °¨ Getting regular exercise.   °¨ Managing your blood pressure.   °¨ Limiting your alcohol intake.   °¨ Managing other health problems, such as diabetes.   °· If you need an MRI after your heart stent was placed, be sure to tell the health care provider who orders the MRI that you have a heart stent.   °· Keep all follow-up  visits as directed by your health care provider.   °SEEK IMMEDIATE MEDICAL CARE IF:  °· You develop chest pain, shortness of breath, feel faint, or pass out. °· You have bleeding, swelling larger than a walnut, or drainage from the catheter insertion site. °· You develop pain, discoloration, coldness, or severe bruising in the leg or arm that held the catheter. °· You develop bleeding from any other place such as from the bowels. There may be bright red blood in the urine or stools, or it may appear as black, tarry stools. °· You have a fever or chills. °MAKE SURE YOU: °· Understand these instructions. °· Will watch your condition. °· Will get help right away if you are not doing well or get worse. °Document Released: 11/15/2004 Document Revised: 09/12/2013 Document Reviewed: 09/29/2012 °ExitCare® Patient Information ©2015 [employer]. This information is not intended to replace advice given to you by your health care provider. Make sure you discuss any questions you have with your health care provider. ° °

## 2014-09-30 NOTE — Progress Notes (Signed)
SUBJECTIVE: The patient is doing well today.  At this time, he denies chest pain, shortness of breath, or any new concerns.  Wants to go home.  . angioplasty book   Does not apply Once  . aspirin  81 mg Oral Daily  . atorvastatin  80 mg Oral q1800  . carvedilol  3.125 mg Oral BID WC  . clopidogrel  75 mg Oral Q breakfast  . heparin  5,000 Units Subcutaneous 3 times per day  . levothyroxine  200 mcg Oral QAC breakfast  . nicotine  21 mg Transdermal Daily   . sodium chloride      OBJECTIVE: Physical Exam: Filed Vitals:   09/29/14 1941 09/30/14 0006 09/30/14 0414 09/30/14 0804  BP: 123/82 121/61 110/53 114/53  Pulse: 76 72 70 71  Temp: 98.1 F (36.7 C) 97.9 F (36.6 C) 98.2 F (36.8 C) 98.3 F (36.8 C)  TempSrc: Oral Oral Oral Oral  Resp: 15 21 20 18   Height:      Weight:  300 lb 11.3 oz (136.4 kg)    SpO2: 97% 97% 94% 97%    Intake/Output Summary (Last 24 hours) at 09/30/14 1045 Last data filed at 09/30/14 0731  Gross per 24 hour  Intake 1653.95 ml  Output   2050 ml  Net -396.05 ml    Telemetry reveals sinus rhythm with pacs  GEN- The patient is well appearing, alert and oriented x 3 today.   Head- normocephalic, atraumatic Eyes-  Sclera clear, conjunctiva pink Ears- hearing intact Oropharynx- clear Neck- supple, no JVP Lymph- no cervical lymphadenopathy Lungs- Clear to ausculation bilaterally, normal work of breathing Heart- Regular rate and rhythm, no murmurs, rubs or gallops, PMI not laterally displaced GI- soft, NT, ND, + BS Extremities- no clubbing, cyanosis, or edema, R wrist site s/p cath looks good (no concerns) Skin- no rash or lesion Psych- euthymic mood, full affect Neuro- strength and sensation are intact  LABS: Basic Metabolic Panel:  Recent Labs  09/29/14 0244 09/30/14 0253  NA 136 137  K 4.0 4.2  CL 102 103  CO2 26 25  GLUCOSE 110* 126*  BUN 14 10  CREATININE 1.01 1.07  CALCIUM 9.0 8.7*   Liver Function Tests:  Recent Labs  09/29/14 0244  AST 28  ALT 26  ALKPHOS 104  BILITOT 0.5  PROT 6.9  ALBUMIN 3.4*   No results for input(s): LIPASE, AMYLASE in the last 72 hours. CBC:  Recent Labs  09/29/14 0244 09/30/14 0253  WBC 7.8 6.6  HGB 13.3 13.5  HCT 40.7 40.6  MCV 81.4 82.0  PLT 267 262   Cardiac Enzymes:  Recent Labs  09/29/14 0244 09/29/14 0631 09/29/14 1321  TROPONINI <0.03 <0.03 <0.03   BNP: Invalid input(s): POCBNP D-Dimer: No results for input(s): DDIMER in the last 72 hours. Hemoglobin A1C:  Recent Labs  09/29/14 0630  HGBA1C 6.4*   Fasting Lipid Panel:  Recent Labs  09/29/14 0244  CHOL 143  HDL 37*  LDLCALC 88  TRIG 89  CHOLHDL 3.9    ASSESSMENT AND PLAN:  Principal Problem:   Chest pain Active Problems:   Hypothyroidism, secondary   Tobacco abuse   Elevated LDL cholesterol level   Obesity (BMI 30-39.9)   Low back pain   OSA on CPAP   HTN (hypertension)   Unstable angina  1. [place]   Mid LAD lesion, 95% stenosed. There is a 0% residual stenosis post intervention. This was treated with a 3.0 x 38 Synergy  drug-eluting stent, postdilated to 3.6 mm in diameter.   Continue dual antiplatelet therapy for at least a year. He needs to stop smoking. He needs aggressive secondary prevention including lipid-lowering therapy, weight loss and healthy diet.   OK to discharge to home I have strongly encouraged cardiac rehab  Will need a transition of care appointment I will arrange.  DC to home today   Thompson Grayer, MD 09/30/2014 10:45 AM

## 2014-10-02 ENCOUNTER — Telehealth: Payer: Self-pay | Admitting: Internal Medicine

## 2014-10-02 ENCOUNTER — Encounter (HOSPITAL_COMMUNITY): Payer: Self-pay | Admitting: Interventional Cardiology

## 2014-10-02 MED FILL — Heparin Sodium (Porcine) 2 Unit/ML in Sodium Chloride 0.9%: INTRAMUSCULAR | Qty: 1000 | Status: AC

## 2014-10-02 MED FILL — Lidocaine HCl Local Preservative Free (PF) Inj 1%: INTRAMUSCULAR | Qty: 30 | Status: AC

## 2014-10-02 NOTE — Telephone Encounter (Signed)
Received page to call Mrs Bounds.  Reports that she noticed for the first time today that he has 2 areas of bruising in his abdomen, described as an "egg".  They had not noticed them until today, asymptomatic but she is the one that saw it.  Not tender.  He was getting SQ heparin for DVT ppx and upon asking he thinks he got injections at these sites.  I advised to monitor but no need to seek attention tonight.  They have follow up arranged at which time they can have these assessed. However, if any changed, we would be happy to assess here.

## 2014-11-03 ENCOUNTER — Ambulatory Visit: Payer: Non-veteran care | Admitting: Interventional Cardiology

## 2014-11-07 ENCOUNTER — Telehealth (HOSPITAL_COMMUNITY): Payer: Self-pay | Admitting: Cardiac Rehabilitation

## 2014-11-07 NOTE — Telephone Encounter (Signed)
pc to pt to discuss enrolling in cardiac rehab. Pt declined due to work schedule. Pt reports [facility] cardiologist did not feel program important for pt since he has physically active occupation.

## 2015-02-08 ENCOUNTER — Other Ambulatory Visit: Payer: Self-pay

## 2015-02-08 ENCOUNTER — Emergency Department
Admission: EM | Admit: 2015-02-08 | Discharge: 2015-02-08 | Disposition: A | Discharge status: Home / Self Care | Payer: Non-veteran care | Attending: Emergency Medicine | Admitting: Emergency Medicine

## 2015-02-08 ENCOUNTER — Encounter: Payer: Self-pay | Admitting: Emergency Medicine

## 2015-02-08 DIAGNOSIS — R55 Syncope and collapse: Secondary | ICD-10-CM

## 2015-02-08 DIAGNOSIS — Z7982 Long term (current) use of aspirin: Secondary | ICD-10-CM | POA: Insufficient documentation

## 2015-02-08 DIAGNOSIS — I10 Essential (primary) hypertension: Secondary | ICD-10-CM | POA: Insufficient documentation

## 2015-02-08 DIAGNOSIS — Z7902 Long term (current) use of antithrombotics/antiplatelets: Secondary | ICD-10-CM | POA: Insufficient documentation

## 2015-02-08 DIAGNOSIS — Z79899 Other long term (current) drug therapy: Secondary | ICD-10-CM | POA: Insufficient documentation

## 2015-02-08 DIAGNOSIS — Z720 Tobacco use: Secondary | ICD-10-CM | POA: Insufficient documentation

## 2015-02-08 LAB — CBC
HEMATOCRIT: 40 % (ref 40.0–52.0)
Hemoglobin: 13.1 g/dL (ref 13.0–18.0)
MCH: 26.8 pg (ref 26.0–34.0)
MCHC: 32.9 g/dL (ref 32.0–36.0)
MCV: 81.5 fL (ref 80.0–100.0)
Platelets: 266 10*3/uL (ref 150–440)
RBC: 4.9 MIL/uL (ref 4.40–5.90)
RDW: 14.8 % — AB (ref 11.5–14.5)
WBC: 5.9 10*3/uL (ref 3.8–10.6)

## 2015-02-08 LAB — BASIC METABOLIC PANEL
Anion gap: 3 — ABNORMAL LOW (ref 5–15)
BUN: 15 mg/dL (ref 6–20)
CHLORIDE: 107 mmol/L (ref 101–111)
CO2: 24 mmol/L (ref 22–32)
Calcium: 8.6 mg/dL — ABNORMAL LOW (ref 8.9–10.3)
Creatinine, Ser: 1.08 mg/dL (ref 0.61–1.24)
GFR calc Af Amer: >60 mL/min (ref >60)
GFR calc non Af Amer: >60 mL/min (ref >60)
GLUCOSE: 127 mg/dL — AB (ref 65–99)
POTASSIUM: 3.7 mmol/L (ref 3.5–5.1)
Sodium: 134 mmol/L — ABNORMAL LOW (ref 135–145)

## 2015-02-08 LAB — TROPONIN I: Troponin I: <0.03 ng/mL (ref <0.031)

## 2015-02-08 NOTE — Discharge Instructions (Signed)
Near-Syncope Near-syncope (commonly known as near fainting) is sudden weakness, dizziness, or feeling like you might pass out. During an episode of near-syncope, you may also develop pale skin, have tunnel vision, or feel sick to your stomach (nauseous). Near-syncope may occur when getting up after sitting or while standing for a long time. It is caused by a sudden decrease in blood flow to the brain. This decrease can result from various causes or triggers, most of which are not serious. However, because near-syncope can sometimes be a sign of something serious, a medical evaluation is required. The specific cause is often not determined. HOME CARE INSTRUCTIONS  Monitor your condition for any changes. The following actions may help to alleviate any discomfort you are experiencing:  Have someone stay with you until you feel stable.  Lie down right away and prop your feet up if you start feeling like you might faint. Breathe deeply and steadily. Wait until all the symptoms have passed. Most of these episodes last only a few minutes. You may feel tired for several hours.   Drink enough fluids to keep your urine clear or pale yellow.   If you are taking blood pressure or heart medicine, get up slowly when seated or lying down. Take several minutes to sit and then stand. This can reduce dizziness.  Follow up with your health care provider as directed. SEEK IMMEDIATE MEDICAL CARE IF:   You have a severe headache.   You have unusual pain in the chest, abdomen, or back.   You are bleeding from the mouth or rectum, or you have black or tarry stool.   You have an irregular or very fast heartbeat.   You have repeated fainting or have seizure-like jerking during an episode.   You faint when sitting or lying down.   You have confusion.   You have difficulty walking.   You have severe weakness.   You have vision problems.  MAKE SURE YOU:   Understand these instructions.  Will  watch your condition.  Will get help right away if you are not doing well or get worse. Document Released: 04/28/2005 Document Revised: 05/03/2013 Document Reviewed: 10/01/2012 ExitCare Patient Information 2015 [employer]. This information is not intended to replace advice given to you by your health care provider. Make sure you discuss any questions you have with your health care provider.  

## 2015-02-08 NOTE — ED Notes (Signed)
Pt presents with headache and dizziness acute onset at 0808 today. Had eaten breakfast prior to episode. Pt states he felt like his equalibrium has been off this am. States he could hardly hold an apple in his hands. Pt with cardiac stent placed about six mths ago. No facial droop noted, equal hand grips noted in triage, no slurred speech noted. Pt speaking in complete sentences.

## 2015-02-08 NOTE — ED Provider Notes (Signed)
[facility] Emergency Department Provider Note  ____________________________________________  Time seen: On arrival  I have reviewed the triage vital signs and the nursing notes.   HISTORY  Chief Complaint Dizziness    HPI Frank Nguyen is a 49 y.o. male who presents with complaints of lightheadedness. He was feeling well this morning. At work he was walking to a different station and felt lightheaded but ignored it. He suddenly felt lightheaded again and shortly thereafter felt like his knees were unremarkable. He sat down and ate a banana and felt better. He denies chest pain. Denies palpations. Does have history of coronary artery disease. No shortness of breath. No recent travel pain.     Past Medical History  Diagnosis Date  . Tumor, thyroid 2010    No records seen, only self-reported   . Sleep apnea 2009    on CPAP  . HLD (hyperlipidemia)   . Hypothyroid   . Tobacco abuse   . HTN (hypertension)     Patient Active Problem List   Diagnosis Date Noted  . Chest pain 09/29/2014  . OSA on CPAP 09/29/2014  . Pain in the chest   . HTN (hypertension)   . Unstable angina   . Left ankle pain 01/21/2013  . Low back pain 07/23/2012  . Disordered sleep 01/28/2012  . Plantar fasciitis, bilateral 01/28/2012  . Hypothyroidism, secondary 06/17/2011  . Tobacco abuse 06/17/2011  . Elevated LDL cholesterol level 06/17/2011  . Obesity (BMI 30-39.9) 06/17/2011    Past Surgical History  Procedure Laterality Date  . Thyroidectomy  2010    at [facility] in [place]  . Tonsillectomy and adenoidectomy    . Cardiac catheterization N/A 09/29/2014    Procedure: Left Heart Cath and Coronary Angiography;  Surgeon: Jettie Booze, MD;  Location: [place] [facility];  Service: Cardiovascular;  Laterality: N/A;    Current Outpatient Rx  Name  Route  Sig  Dispense  Refill  . acetaminophen (TYLENOL) 500 MG tablet   Oral   Take 500 mg by mouth every 6 (six)  hours as needed.         Marland Kitchen aspirin 81 MG chewable tablet   Oral   Chew 1 tablet (81 mg total) by mouth daily.   30 tablet   0   . atorvastatin (LIPITOR) 80 MG tablet   Oral   Take 1 tablet (80 mg total) by mouth daily at 6 PM.   30 tablet   0   . carvedilol (COREG) 3.125 MG tablet   Oral   Take 1 tablet (3.125 mg total) by mouth 2 (two) times daily with a meal.   60 tablet   0   . clopidogrel (PLAVIX) 75 MG tablet   Oral   Take 1 tablet (75 mg total) by mouth daily with breakfast.   30 tablet   0   . hydrochlorothiazide (HYDRODIURIL) 25 MG tablet   Oral   Take 25 mg by mouth daily.         Marland Kitchen levothyroxine (SYNTHROID, LEVOTHROID) 200 MCG tablet   Oral   Take 200 mcg by mouth daily before breakfast.         . nitroGLYCERIN (NITROSTAT) 0.4 MG SL tablet   Sublingual   Place 1 tablet (0.4 mg total) under the tongue every 5 (five) minutes as needed for chest pain.   30 tablet   0   . EXPIRED: levothyroxine (SYNTHROID, LEVOTHROID) 200 MCG tablet   Oral  Take 1 tablet (200 mcg total) by mouth daily.   30 tablet   5     Patient needs office visit for additional refills.     Allergies Review of patient's allergies indicates no known allergies.  Family History  Problem Relation Age of Onset  . Diabetes Mother   . Diabetes Father   . Cancer Sister     Breast  . Diabetes Brother   . Alzheimer's disease      grandparents, aunts, uncles   . Heart failure Mother     Social History Social History  Substance Use Topics  . Smoking status: Current Some Day Smoker -- 0.25 packs/day for 20 years    Types: Cigarettes  . Smokeless tobacco: None  . Alcohol Use: No    Review of Systems  Constitutional: Negative for fever. Eyes: Negative for visual changes. ENT: Negative for sore throat Cardiovascular: Negative for chest pain. Respiratory: Negative for shortness of breath. Gastrointestinal: Negative for abdominal pain, vomiting and  diarrhea. Genitourinary: Negative for dysuria. Musculoskeletal: Negative for back pain. Skin: Negative for rash. Neurological: Negative for headaches or focal weakness Psychiatric: No anxiety    ____________________________________________   PHYSICAL EXAM:  VITAL SIGNS: ED Triage Vitals  Enc Vitals Group     BP 02/08/15 0913 124/77 mmHg     Pulse Rate 02/08/15 0913 68     Resp 02/08/15 0913 20     Temp 02/08/15 0913 97.6 F (36.4 C)     Temp Source 02/08/15 0913 Oral     SpO2 02/08/15 0913 96 %     Weight 02/08/15 0913 300 lb (136.079 kg)     Height 02/08/15 0913 6\' 1"  (1.854 m)     Head Cir --      Peak Flow --      Pain Score --      Pain Loc --      Pain Edu? --      Excl. in [place]? --      Constitutional: Alert and oriented. Well appearing and in no distress. Eyes: Conjunctivae are normal.  ENT   Head: Normocephalic and atraumatic.   Mouth/Throat: Mucous membranes are moist. Cardiovascular: Normal rate, regular rhythm. Normal and symmetric distal pulses are present in all extremities. No murmurs, rubs, or gallops. Respiratory: Normal respiratory effort without tachypnea nor retractions. Breath sounds are clear and equal bilaterally.  Gastrointestinal: Soft and non-tender in all quadrants. No distention. There is no CVA tenderness. Genitourinary: deferred Musculoskeletal: Nontender with normal range of motion in all extremities. No lower extremity tenderness nor edema. Neurologic:  Normal speech and language. No gross focal neurologic deficits are appreciated. Skin:  Skin is warm, dry and intact. No rash noted. Psychiatric: Mood and affect are normal. Patient exhibits appropriate insight and judgment.  ____________________________________________    LABS (pertinent positives/negatives)  Labs Reviewed  BASIC METABOLIC PANEL - Abnormal; Notable for the following:    Sodium 134 (*)    Glucose, Bld 127 (*)    Calcium 8.6 (*)    Anion gap 3 (*)    All  other components within normal limits  CBC - Abnormal; Notable for the following:    RDW 14.8 (*)    All other components within normal limits  TROPONIN I    ____________________________________________   EKG  ED ECG REPORT I, Lavonia Drafts, the attending physician, personally viewed and interpreted this ECG.  Date: 02/08/2015 EKG Time: 68 Rate: Normal sinus rhythm Rhythm: normal sinus rhythm QRS Axis: normal  Intervals: normal ST/T Wave abnormalities: normal Conduction Disutrbances: none Narrative Interpretation: unremarkable   ____________________________________________    RADIOLOGY I have personally reviewed any xrays that were ordered on this patient: None  ____________________________________________   PROCEDURES  Procedure(s) performed: none  Critical Care performed: none  ____________________________________________   INITIAL IMPRESSION / ASSESSMENT AND PLAN / ED COURSE  Pertinent labs & imaging results that were available during my care of the patient were reviewed by me and considered in my medical decision making (see chart for details).  Patient overall well-appearing. His EKG and labs are unremarkable. His exam is benign. He is no longer having dizziness in the emergency department. He denies palpitations or chest pain. He may be slightly dehydrated. We'll give 1 L normal saline check orthostatics and if normal we will discharge with PCP follow-up  Heart rate listed at 33 is erroneous and incorrect. Patient's heart rate is actually in the 60s  ____________________________________________   FINAL CLINICAL IMPRESSION(S) / ED DIAGNOSES  Final diagnoses:  Near syncope     Lavonia Drafts, MD 02/08/15 1244

## 2015-07-24 IMAGING — CR LEFT THUMB 2+V
1 series · 3 of 3 positions shown · non-contrast
Comparison: None.

CLINICAL DATA: Staple injury

EXAM:
LEFT THUMB 2+V

[Series 1: dxr thumb left hand (1st digit) · 0.14mm/px · 3 of 3 slices shown]
[im 1/3]
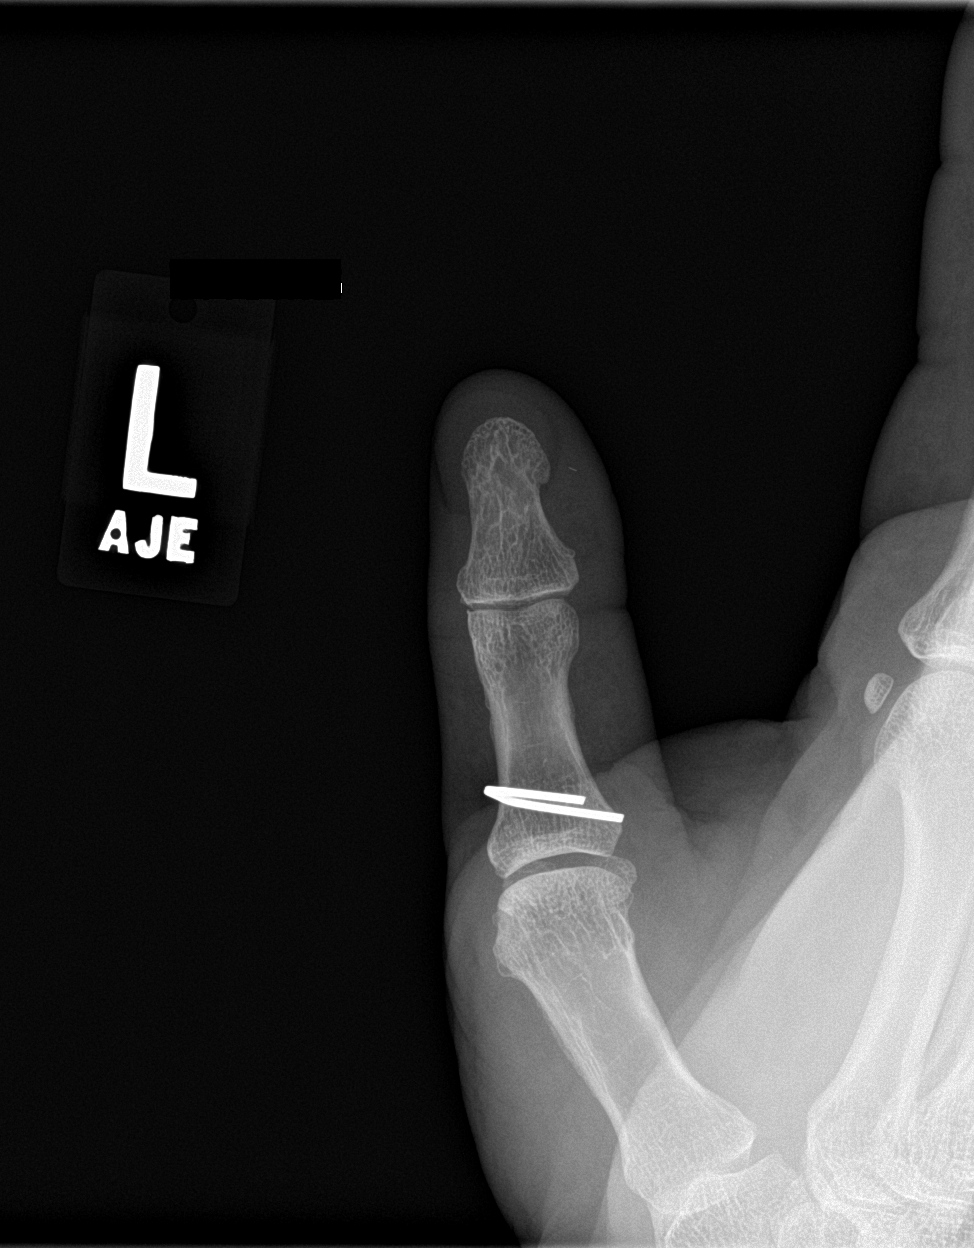
[im 2/3]
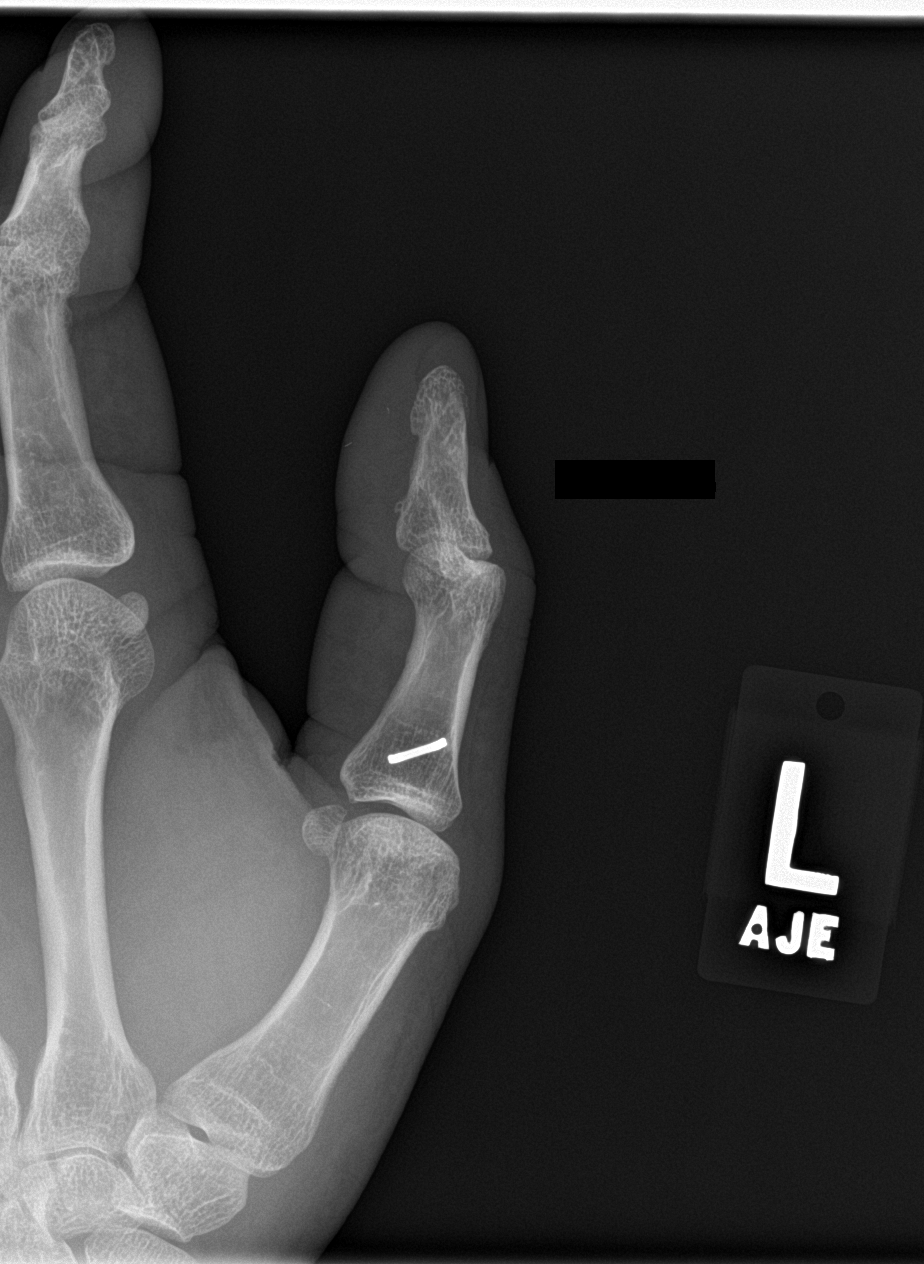
[im 3/3]
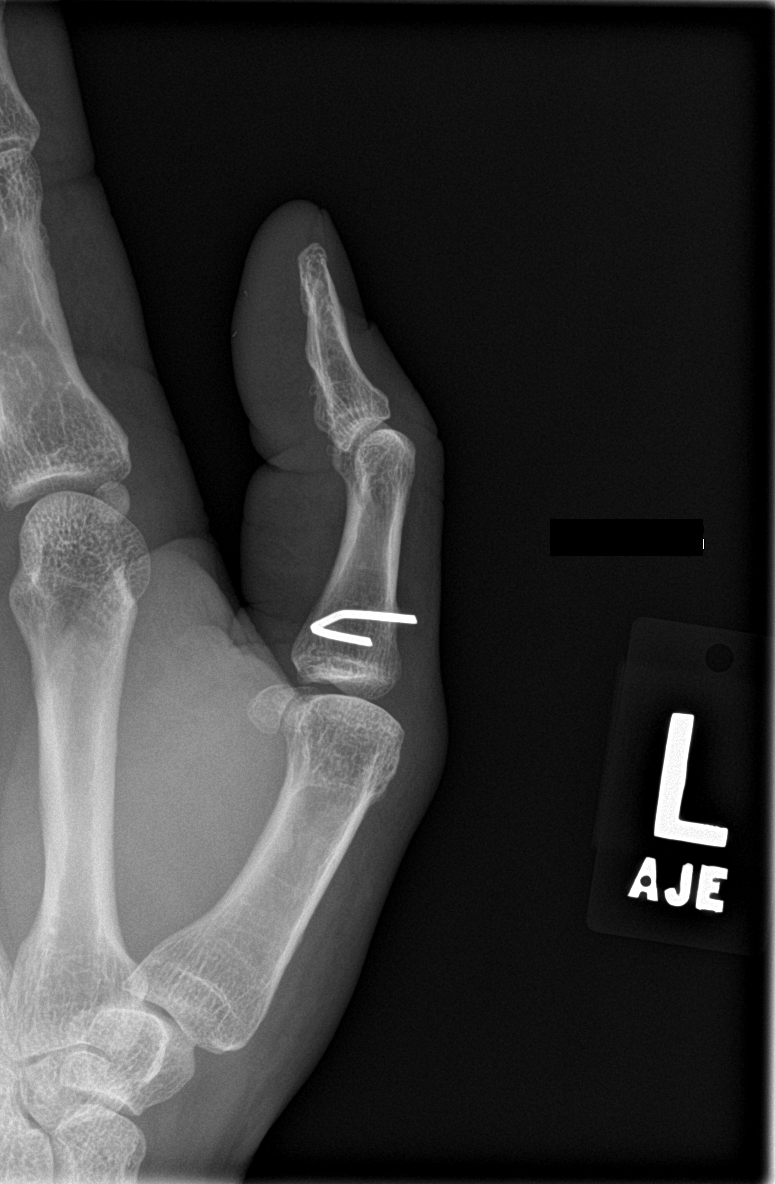

[3 of 3 positions shown; findings below may reference images not displayed]

FINDINGS: Metal staple is present within the left first proximal phalanx.
Staple extends into the bone without fracture. No significant
arthropathy.
IMPRESSION: Staple within the first proximal phalanx without fracture.

## 2016-07-25 ENCOUNTER — Telehealth: Payer: Self-pay | Admitting: Family Medicine

## 2016-07-25 NOTE — Telephone Encounter (Signed)
No answer. LMOVM. Overdue HM.

## 2020-07-26 ENCOUNTER — Telehealth (HOSPITAL_COMMUNITY): Payer: Self-pay | Admitting: Emergency Medicine

## 2020-07-26 ENCOUNTER — Telehealth: Payer: Self-pay | Admitting: *Deleted

## 2020-07-26 ENCOUNTER — Emergency Department (HOSPITAL_COMMUNITY)
Admission: EM | Admit: 2020-07-26 | Discharge: 2020-07-26 | Disposition: A | Discharge status: Home / Self Care | Payer: No Typology Code available for payment source | Attending: Emergency Medicine | Admitting: Emergency Medicine

## 2020-07-26 ENCOUNTER — Emergency Department (HOSPITAL_COMMUNITY): Payer: No Typology Code available for payment source

## 2020-07-26 DIAGNOSIS — Z79899 Other long term (current) drug therapy: Secondary | ICD-10-CM | POA: Diagnosis not present

## 2020-07-26 DIAGNOSIS — Z7982 Long term (current) use of aspirin: Secondary | ICD-10-CM | POA: Diagnosis not present

## 2020-07-26 DIAGNOSIS — Z7902 Long term (current) use of antithrombotics/antiplatelets: Secondary | ICD-10-CM | POA: Insufficient documentation

## 2020-07-26 DIAGNOSIS — I10 Essential (primary) hypertension: Secondary | ICD-10-CM | POA: Insufficient documentation

## 2020-07-26 DIAGNOSIS — M5416 Radiculopathy, lumbar region: Secondary | ICD-10-CM | POA: Diagnosis not present

## 2020-07-26 DIAGNOSIS — F17210 Nicotine dependence, cigarettes, uncomplicated: Secondary | ICD-10-CM | POA: Insufficient documentation

## 2020-07-26 DIAGNOSIS — E039 Hypothyroidism, unspecified: Secondary | ICD-10-CM | POA: Insufficient documentation

## 2020-07-26 DIAGNOSIS — Y92481 Parking lot as the place of occurrence of the external cause: Secondary | ICD-10-CM | POA: Insufficient documentation

## 2020-07-26 DIAGNOSIS — M549 Dorsalgia, unspecified: Secondary | ICD-10-CM | POA: Diagnosis present

## 2020-07-26 MED ORDER — HYDROCODONE-ACETAMINOPHEN 5-325 MG PO TABS: 1.0000 | ORAL_TABLET | ORAL | 0 refills | Status: DC | PRN

## 2020-07-26 MED ORDER — DIAZEPAM 5 MG PO TABS: 5.0000 mg | ORAL_TABLET | Freq: Two times a day (BID) | ORAL | 0 refills | Status: DC

## 2020-07-26 MED ORDER — DIAZEPAM 5 MG PO TABS: 5.0000 mg | ORAL_TABLET | Freq: Two times a day (BID) | ORAL | 0 refills | Status: AC

## 2020-07-26 MED ORDER — MORPHINE SULFATE (PF) 4 MG/ML IV SOLN
4.0000 mg | Freq: Once | INTRAVENOUS | Status: AC
  Administered 2020-07-26: 4 mg via INTRAMUSCULAR
  Filled 2020-07-26: qty 1

## 2020-07-26 MED ORDER — PREDNISONE 10 MG (21) PO TBPK: ORAL_TABLET | ORAL | 0 refills | Status: DC
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
## 2020-07-26 NOTE — Telephone Encounter (Signed)
Pt called to have Rx sent to [facility].  RNCM relayed request to EDP.

## 2020-07-26 NOTE — Telephone Encounter (Signed)
Pt requests that meds be sent to the [facility] instead of the [facility] he requested earlier.

## 2020-07-26 NOTE — ED Notes (Addendum)
Pt. Ambulated in the hall. Complained of left hip pain while ambulating. Denied any dizziness or SHOB while ambulating. Oxygen Saturation between 98-100%.

## 2020-07-26 NOTE — Discharge Instructions (Addendum)
Take tylenol or ibuprofen for pain.  If this does not help, then take the Lortab and/or Valium.  Do not take both tylenol and Lortab together as the Lortab has tylenol in it.

## 2020-08-06 ENCOUNTER — Ambulatory Visit: Payer: Non-veteran care | Admitting: Orthopedic Surgery
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
## 2022-09-18 ENCOUNTER — Encounter (HOSPITAL_COMMUNITY): Payer: Self-pay

## 2022-09-18 ENCOUNTER — Other Ambulatory Visit: Payer: Self-pay

## 2022-09-18 ENCOUNTER — Emergency Department (HOSPITAL_COMMUNITY): Payer: No Typology Code available for payment source

## 2022-09-18 ENCOUNTER — Emergency Department (HOSPITAL_COMMUNITY)
Admission: EM | Admit: 2022-09-18 | Discharge: 2022-09-19 | Disposition: A | Discharge status: Home / Self Care | Payer: No Typology Code available for payment source | Attending: Emergency Medicine | Admitting: Emergency Medicine

## 2022-09-18 DIAGNOSIS — I10 Essential (primary) hypertension: Secondary | ICD-10-CM | POA: Insufficient documentation

## 2022-09-18 DIAGNOSIS — R1032 Left lower quadrant pain: Secondary | ICD-10-CM | POA: Diagnosis present

## 2022-09-18 DIAGNOSIS — Z7902 Long term (current) use of antithrombotics/antiplatelets: Secondary | ICD-10-CM | POA: Insufficient documentation

## 2022-09-18 DIAGNOSIS — Z7982 Long term (current) use of aspirin: Secondary | ICD-10-CM | POA: Insufficient documentation

## 2022-09-18 DIAGNOSIS — Z79899 Other long term (current) drug therapy: Secondary | ICD-10-CM | POA: Insufficient documentation

## 2022-09-18 DIAGNOSIS — E039 Hypothyroidism, unspecified: Secondary | ICD-10-CM | POA: Insufficient documentation

## 2022-09-18 DIAGNOSIS — N201 Calculus of ureter: Secondary | ICD-10-CM

## 2022-09-18 DIAGNOSIS — N132 Hydronephrosis with renal and ureteral calculous obstruction: Secondary | ICD-10-CM | POA: Insufficient documentation

## 2022-09-18 LAB — COMPREHENSIVE METABOLIC PANEL
ALT: 22 U/L (ref 0–44)
AST: 23 U/L (ref 15–41)
Albumin: 3.5 g/dL (ref 3.5–5.0)
Alkaline Phosphatase: 121 U/L (ref 38–126)
Anion gap: 12 (ref 5–15)
BUN: 15 mg/dL (ref 6–20)
CO2: 23 mmol/L (ref 22–32)
Calcium: 9.5 mg/dL (ref 8.9–10.3)
Chloride: 102 mmol/L (ref 98–111)
Creatinine, Ser: 1.44 mg/dL — ABNORMAL HIGH (ref 0.61–1.24)
GFR, Estimated: 57 mL/min — ABNORMAL LOW (ref >60)
Glucose, Bld: 106 mg/dL — ABNORMAL HIGH (ref 70–99)
Potassium: 4.1 mmol/L (ref 3.5–5.1)
Sodium: 137 mmol/L (ref 135–145)
Total Bilirubin: 0.2 mg/dL — ABNORMAL LOW (ref 0.3–1.2)
Total Protein: 7.4 g/dL (ref 6.5–8.1)

## 2022-09-18 LAB — CBC WITH DIFFERENTIAL/PLATELET
Abs Immature Granulocytes: 0.02 10*3/uL (ref 0.00–0.07)
Basophils Absolute: 0 10*3/uL (ref 0.0–0.1)
Basophils Relative: 0 %
Eosinophils Absolute: 0.1 10*3/uL (ref 0.0–0.5)
Eosinophils Relative: 1 %
HCT: 42.1 % (ref 39.0–52.0)
Hemoglobin: 14 g/dL (ref 13.0–17.0)
Immature Granulocytes: 0 %
Lymphocytes Relative: 25 %
Lymphs Abs: 2.3 10*3/uL (ref 0.7–4.0)
MCH: 26.8 pg (ref 26.0–34.0)
MCHC: 33.3 g/dL (ref 30.0–36.0)
MCV: 80.7 fL (ref 80.0–100.0)
Monocytes Absolute: 0.8 10*3/uL (ref 0.1–1.0)
Monocytes Relative: 9 %
Neutro Abs: 5.8 10*3/uL (ref 1.7–7.7)
Neutrophils Relative %: 65 %
Platelets: 286 10*3/uL (ref 150–400)
RBC: 5.22 MIL/uL (ref 4.22–5.81)
RDW: 13.8 % (ref 11.5–15.5)
WBC: 9.1 10*3/uL (ref 4.0–10.5)
nRBC: 0 % (ref 0.0–0.2)

## 2022-09-18 LAB — URINALYSIS, ROUTINE W REFLEX MICROSCOPIC
Bacteria, UA: NONE SEEN
Bilirubin Urine: NEGATIVE
Glucose, UA: NEGATIVE mg/dL
Ketones, ur: NEGATIVE mg/dL
Leukocytes,Ua: NEGATIVE
Nitrite: NEGATIVE
Protein, ur: NEGATIVE mg/dL
Specific Gravity, Urine: 1.021 (ref 1.005–1.030)
pH: 6 (ref 5.0–8.0)

## 2022-09-18 LAB — I-STAT CHEM 8, ED
BUN: 19 mg/dL (ref 6–20)
Calcium, Ion: 1.22 mmol/L (ref 1.15–1.40)
Chloride: 103 mmol/L (ref 98–111)
Creatinine, Ser: 1.5 mg/dL — ABNORMAL HIGH (ref 0.61–1.24)
Glucose, Bld: 99 mg/dL (ref 70–99)
HCT: 43 % (ref 39.0–52.0)
Hemoglobin: 14.6 g/dL (ref 13.0–17.0)
Potassium: 4.1 mmol/L (ref 3.5–5.1)
Sodium: 139 mmol/L (ref 135–145)
TCO2: 26 mmol/L (ref 22–32)

## 2022-09-18 LAB — LIPASE, BLOOD: Lipase: 33 U/L (ref 11–51)

## 2022-09-18 LAB — TROPONIN I (HIGH SENSITIVITY): Troponin I (High Sensitivity): 5 ng/L (ref <18)

## 2022-09-18 MED ORDER — HYDROMORPHONE HCL 1 MG/ML IJ SOLN
1.0000 mg | Freq: Once | INTRAMUSCULAR | Status: AC
  Administered 2022-09-18: 1 mg via INTRAVENOUS
  Filled 2022-09-18: qty 1

## 2022-09-18 MED ORDER — IOHEXOL 350 MG/ML SOLN
75.0000 mL | Freq: Once | INTRAVENOUS | Status: AC | PRN
  Administered 2022-09-18: 75 mL via INTRAVENOUS

## 2022-09-18 MED ORDER — ONDANSETRON HCL 4 MG/2ML IJ SOLN
4.0000 mg | Freq: Once | INTRAMUSCULAR | Status: AC
  Administered 2022-09-18: 4 mg via INTRAVENOUS
  Filled 2022-09-18: qty 2

## 2022-09-18 NOTE — ED Provider Notes (Signed)
[place] EMERGENCY DEPARTMENT AT [facility] Provider Note   CSN: [id number] Arrival date & time: 09/18/22  2140     History  Chief Complaint  Patient presents with   Abdominal Pain    Frank Nguyen is a 57 y.o. male.  The history is provided by the patient and medical records.  Abdominal Pain  57 y.o. M with history of hypothyroidism, hypertension, hyperlipidemia, sleep apnea, presenting to the ED for left lower quadrant abdominal pain.  Patient states earlier in the afternoon he noticed some mild lower abdominal discomfort, he went to his granddaughter's graduation around 4 PM and after climbing up the bleachers he noticed acutely worsening pain in his left lower abdomen.  He left the graduation and pain has been steadily increasing since that time.  He reports some nausea but denies any vomiting.  He has not had any diarrhea.  He denies any urinary changes or hematuria.  He is due for heart cath on Monday, 09/22/2022.  He denies any active chest pain or shortness of breath at this time.  Home Medications Prior to Admission medications   Medication Sig Start Date End Date Taking? Authorizing Provider  ondansetron (ZOFRAN-ODT) 4 MG disintegrating tablet Take 1 tablet (4 mg total) by mouth every 8 (eight) hours as needed for nausea. 09/19/22  Yes Garlon Hatchet, PA-C  oxyCODONE-acetaminophen (PERCOCET) 5-325 MG tablet Take 1 tablet by mouth every 4 (four) hours as needed. 09/19/22  Yes Garlon Hatchet, PA-C  tamsulosin (FLOMAX) 0.4 MG CAPS capsule Take 1 capsule (0.4 mg total) by mouth daily after supper. 09/19/22  Yes Garlon Hatchet, PA-C  acetaminophen (TYLENOL) 500 MG tablet Take 500 mg by mouth every 6 (six) hours as needed.    [provider]  aspirin 81 MG chewable tablet Chew 1 tablet (81 mg total) by mouth daily. 09/30/14   Mikhail, Nita Sells, DO  atorvastatin (LIPITOR) 80 MG tablet Take 1 tablet (80 mg total) by mouth daily at 6 PM. 09/30/14   Edsel Petrin,  DO  carvedilol (COREG) 3.125 MG tablet Take 1 tablet (3.125 mg total) by mouth 2 (two) times daily with a meal. 09/30/14   Edsel Petrin, DO  clopidogrel (PLAVIX) 75 MG tablet Take 1 tablet (75 mg total) by mouth daily with breakfast. 09/30/14   Edsel Petrin, DO  diazepam (VALIUM) 5 MG tablet Take 1 tablet (5 mg total) by mouth 2 (two) times daily. 07/26/20   Jacalyn Lefevre, MD  hydrochlorothiazide (HYDRODIURIL) 25 MG tablet Take 25 mg by mouth daily.    [provider]  HYDROcodone-acetaminophen (NORCO/VICODIN) 5-325 MG tablet Take 1 tablet by mouth every 4 (four) hours as needed. 07/26/20   Jacalyn Lefevre, MD  levothyroxine (SYNTHROID, LEVOTHROID) 200 MCG tablet Take 1 tablet (200 mcg total) by mouth daily. 11/30/13 11/30/14  McKeag, Janine Ores, MD  levothyroxine (SYNTHROID, LEVOTHROID) 200 MCG tablet Take 200 mcg by mouth daily before breakfast.    [provider]  nitroGLYCERIN (NITROSTAT) 0.4 MG SL tablet Place 1 tablet (0.4 mg total) under the tongue every 5 (five) minutes as needed for chest pain. 09/30/14   Mikhail, Nita Sells, DO  predniSONE (STERAPRED UNI-PAK 21 TAB) 10 MG (21) TBPK tablet Take 6 tabs for 2 days, then 5 for 2 days, then 4 for 2 days, then 3 for 2 days, 2 for 2 days, then 1 for 2 days 07/26/20   Jacalyn Lefevre, MD      Allergies    Patient has no  known allergies.    Review of Systems   Review of Systems  Gastrointestinal:  Positive for abdominal pain.  All other systems reviewed and are negative.   Physical Exam Updated Vital Signs BP (!) 151/138   Pulse 72   Temp 98.5 F (36.9 C) (Oral)   Resp 12   Ht 6\' 1"  (1.854 m)   Wt (!) 144.2 kg   SpO2 97%   BMI 41.96 kg/m   Physical Exam Vitals and nursing note reviewed.  Constitutional:      Appearance: He is well-developed.     Comments: Intermittently moaning, seems uncomfortable  HENT:     Head: Normocephalic and atraumatic.  Eyes:     Conjunctiva/sclera: Conjunctivae normal.     Pupils:  Pupils are equal, round, and reactive to light.  Cardiovascular:     Rate and Rhythm: Normal rate and regular rhythm.     Heart sounds: Normal heart sounds.  Pulmonary:     Effort: Pulmonary effort is normal.     Breath sounds: Normal breath sounds.  Abdominal:     General: Bowel sounds are normal.     Palpations: Abdomen is soft.     Tenderness: There is abdominal tenderness in the left lower quadrant.  Musculoskeletal:        General: Normal range of motion.     Cervical back: Normal range of motion.  Skin:    General: Skin is warm and dry.  Neurological:     Mental Status: He is alert and oriented to person, place, and time.     ED Results / Procedures / Treatments   Labs (all labs ordered are listed, but only abnormal results are displayed) Labs Reviewed  COMPREHENSIVE METABOLIC PANEL - Abnormal; Notable for the following components:      Result Value   Glucose, Bld 106 (*)    Creatinine, Ser 1.44 (*)    Total Bilirubin 0.2 (*)    GFR, Estimated 57 (*)    All other components within normal limits  URINALYSIS, ROUTINE W REFLEX MICROSCOPIC - Abnormal; Notable for the following components:   Hgb urine dipstick MODERATE (*)    All other components within normal limits  I-STAT CHEM 8, ED - Abnormal; Notable for the following components:   Creatinine, Ser 1.50 (*)    All other components within normal limits  LIPASE, BLOOD  CBC WITH DIFFERENTIAL/PLATELET  TROPONIN I (HIGH SENSITIVITY)    EKG None  Radiology CT ABDOMEN PELVIS W CONTRAST  Result Date: 09/18/2022 CLINICAL DATA:  Left lower quadrant pain for several hours, initial encounter EXAM: CT ABDOMEN AND PELVIS WITH CONTRAST TECHNIQUE: Multidetector CT imaging of the abdomen and pelvis was performed using the standard protocol following bolus administration of intravenous contrast. RADIATION DOSE REDUCTION: This exam was performed according to the departmental dose-optimization program which includes automated  exposure control, adjustment of the mA and/or kV according to patient size and/or use of iterative reconstruction technique. CONTRAST:  75mL OMNIPAQUE IOHEXOL 350 MG/ML SOLN COMPARISON:  None Available. FINDINGS: Lower chest: No acute abnormality. Hepatobiliary: Fatty infiltration of the liver is noted. The gallbladder is well distended and within normal limits. Pancreas: Unremarkable. No pancreatic ductal dilatation or surrounding inflammatory changes. Spleen: Normal in size without focal abnormality. Adrenals/Urinary Tract: Adrenal glands are within normal limits. Kidneys are well visualized bilaterally within normal enhancement pattern. Mild perinephric stranding is noted on the left. Mild fullness of the collecting system and ureter is noted on the left as well. Punctate left  mid ureteral stone is noted causing the obstructive change. The more distal left ureter is within normal limits. The bladder is well distended. Stomach/Bowel: Scattered diverticular change of the colon is noted without evidence of diverticulitis. No obstructive or inflammatory changes are seen. The appendix is within normal limits. Small bowel and stomach are unremarkable. Vascular/Lymphatic: Aortic atherosclerosis. No enlarged abdominal or pelvic lymph nodes. Reproductive: Prostate is unremarkable. Other: No abdominal wall hernia or abnormality. No abdominopelvic ascites. Musculoskeletal: No acute or significant osseous findings. IMPRESSION: Punctate left mid ureteral stone with mild hydronephrosis and hydroureter. Fatty liver. Diverticulosis without diverticulitis. Electronically Signed   By: Alcide Clever M.D.   On: 09/18/2022 23:01    Procedures Procedures    Medications Ordered in ED Medications  HYDROmorphone (DILAUDID) injection 1 mg (1 mg Intravenous Given 09/18/22 2219)  ondansetron (ZOFRAN) injection 4 mg (4 mg Intravenous Given 09/18/22 2218)  iohexol (OMNIPAQUE) 350 MG/ML injection 75 mL (75 mLs Intravenous Contrast Given  09/18/22 2252)    ED Course/ Medical Decision Making/ A&P                             Medical Decision Making Amount and/or Complexity of Data Reviewed Labs: ordered. Radiology: ordered and independent interpretation performed. ECG/medicine tests: ordered and independent interpretation performed.  Risk Prescription drug management.   57 year old male presenting to the ED with left lower abdominal pain for the past several hours.  Pain has been steadily worsening with some waves of nausea but no vomiting.  He is afebrile and nontoxic in appearance here.  He does appear uncomfortable on exam, writhing around a bit.  Labs were obtained from triage without leukocytosis or electrolyte derangement.  Normal lipase.  Normal troponin--no active chest pain or shortness of breath.  CT with punctate left ureteral calculus with mild hydro.  After single dose of Dilaudid he is completely pain-free.  He is tolerating oral fluids and sandwich without issue.  Results discussed with patient and wife, they voiced understanding.  Discussed expected management.  Plan to discharge home with symptomatic control--Percocet, Zofran, Flomax.  He is given urology follow-up.  Strict return precautions given for any new or acute changes.  Final Clinical Impression(s) / ED Diagnoses Final diagnoses:  Left ureteral stone    Rx / DC Orders ED Discharge Orders          Ordered    oxyCODONE-acetaminophen (PERCOCET) 5-325 MG tablet  Every 4 hours PRN        09/19/22 0020    ondansetron (ZOFRAN-ODT) 4 MG disintegrating tablet  Every 8 hours PRN        09/19/22 0020    tamsulosin (FLOMAX) 0.4 MG CAPS capsule  Daily after supper        09/19/22 0020              Garlon Hatchet, PA-C 09/19/22 0040    Glyn Ade, MD 09/19/22 1500

## 2022-09-18 NOTE — ED Triage Notes (Signed)
Pt arrived from home BIB for abd pain LLQ for past 3 hrs. Denies n/v PTA, but has nausea now. Denies any new CP, SOB or any other s/s, VSS per EMS, A&O x4.   Cardiac procedure on Monday Coronary angiography w/possible stent placement) at [facility] 5/13

## 2022-09-18 NOTE — ED Notes (Signed)
Pt given sprite, cup of ice, and sandwich

## 2022-09-18 NOTE — ED Notes (Signed)
Pt to CT at this time A&O x4, reports pain improved with medication, no abd pain at this time, wife remains at bedside.

## 2022-09-19 MED ORDER — OXYCODONE-ACETAMINOPHEN 5-325 MG PO TABS: 1.0000 | ORAL_TABLET | ORAL | 0 refills | Status: DC | PRN

## 2022-09-19 MED ORDER — ONDANSETRON 4 MG PO TBDP: 4.0000 mg | ORAL_TABLET | Freq: Three times a day (TID) | ORAL | 0 refills | Status: AC | PRN

## 2022-09-19 MED ORDER — TAMSULOSIN HCL 0.4 MG PO CAPS: 0.4000 mg | ORAL_CAPSULE | Freq: Every day | ORAL | 0 refills | Status: AC

## 2022-09-19 NOTE — Discharge Instructions (Signed)
Take the prescribed medication as directed.  Do not drive while taking pain medication. Follow-up with urology if any ongoing issues. Return to the ED for new or worsening symptoms. 

## 2022-09-22 NOTE — Discharge Summary (Signed)
 ------------------------------------------------------------------------------- Attestation signed by Frank Von Borg, MD at 09/24/2022  2:26 PM ATTENDING ADDENDUM I personally saw the patient. Shared care with Luetta Ee  Frank Nguyen is a history of CAD and prior PCI.  He has been having symptoms of angina and recent coronary angiography that demonstrated borderline significant disease in the LAD.  He was evaluated here with RFR measurement  in the Cath Lab. It was measured to be 0.89  which is consistent with significant lesion.  He underwent successful PCI of the mid LAD with placement of a drug-eluting stent and drug-eluting stent placed in D1.  He had chest pain and mild troponin elevation in setting of transient loss of flow in D1.  This was successfully treated with implantation of DES of D1 with restoration of flow with subsequent resolution of symptoms.  He is stable for discharge.  Frank LOIS Borg, MD  -------------------------------------------------------------------------------  Cardiology Discharge Summary  Patient ID: Frank Nguyen August 31, 1965, male [id number]  Admit date: 09/22/2022 Discharge date and time:  09/23/2022  Admitting Physician: Frank Von Borg, MD Discharge Physician: Frank Von Borg, MD  Admission Diagnoses: Coronary artery disease involving native coronary artery of native heart, unspecified whether angina present [I25.10] S/P cardiac cath [Z98.890] Discharge Diagnoses:  CAD  Admission Condition: stable Discharged Condition: good  Indication for Admission: CAD, PCI  Hospital Course:  For full details, please see H&P, progress notes, consult notes and ancillary notes.  Briefly, Frank Nguyen is a 57 y.o. 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 Below is a summary of the hospital  course.  On 5/13, the patient presented to the cath lab in a fasted and nonsedated state for procedure.  A successful PCI to his LAD and first diagonal was performed without complication via right radial access. Post procedure, the patient was admitted to the cardiology floor for observation overnight. The patient's chronic medical conditions were treated accordingly per the patient's home medication regimen.  The following day the patient did note that he had a boil on his left buttocks.  Reports that he has these in the outpatient setting and normally treats them supportively.  Initially reported pain this morning but resolved after patient noted discharge.  Area was evaluated and was without any erythema or warmth.  No current active drainage.  Advised continued supportive care including warm compresses.  As this seems to be a recurrent issue prescribe patient antibiotics for coverage of MRSA with his recent procedure and advised close follow-up with primary care provider if any changes in the wound, further drainage, fevers, or other concerns.  In addition monitored patient's troponins as patient had some intermittent episodes of chest discomfort postprocedure that improved over the course of the evening.  Troponin peaked at 362 and down trended prior to discharge.  At the time of discharge, the patient is clinically stable with no new symptoms. Physical examination benign compared to yesterday. Remains afebrile, incision site without drainage, no swelling in extremities. Labs and images reviewed. EKG on day of discharge showed sinus rhythm without any other acute abnormalities. Telemetry showed sinus rhythm. Patient has tolerated PO diet, has ambulated and voided without issues.  Tolerated medication without complaints or concerns. Discussed DC plans with pt and family. Answered all of their questions. They agree with discharge instructions. Pt has to come back to ER if symptoms get  worse and any new  symptoms arises. Follow up appointments as stated below. Patient was seen by myself and Dr. Lucienne who agreed the patient is stable for discharge. Pt is being discharged in stable condition.  The patient's medication reconciliation (with changes made to chronic medications), follow-up appointments, discharge orders, instructions and significant lab and diagnostic studies are noted below.    Summarized Medications Changes and Items to be Followed by PCP: -DAPT: ASA 81mg  + Brilinta 90mg  BID -Continue all home medications as prescribed  -Continue BB  -Continue Imdur  -Declines statin at this time, would advise possible initiation in the outpatient setting based on elevated LDL and CAD -Follow up with PCP in 1-2 weeks  -Follow up with cardiology in 4-6 weeks   Discharge medications were reviewed by pharmacy prior to discharge.   Discharge physical exam: Vitals:   09/23/22 0721  BP: 138/81  Pulse:   Resp:   Temp:   SpO2:     General: No acute distress, appears stated age HEENT: Atraumatic, normocephalic, moist mucous membranes CV: Regular rate and rhythm. No appreciable murmur, rub, or gallop. Pulm/ Chest: No respiratory distress or use of accessory musculature. Lungs clear to auscultation bilaterally without wheezing, crackles, or rhonchi. Abd: Soft, nontender, nondistended, normoactive BS Ext: 2+ distal pulses bilaterally, no edema, no cyanosis, no rashes Neuro: Alert and oriented x 4 Skin: Warm, dry, well perfused.  Left buttock with lesion that had central area of nonactive drainage without warmth, tenderness, or erythema. Incision Site: Right radial site is soft and without swelling, tenderness, active bleeding, or hematoma.   Consults:  No orders of the defined types were placed in this encounter.   Significant Diagnostic Studies:  Labs EKG Continuous Telemetry  Treatments:  Medical Therapy PCI  Disposition: home  Patient Instructions:  Discharge Medications:    Medication List     ASK your doctor about these medications    aspirin  81 mg EC tablet Take 1 tablet by mouth daily.   carvediloL  12.5 mg tablet Commonly known as: COREG  Take 1 tablet (12.5 mg total) by mouth in the morning and 1 tablet (12.5 mg total) in the evening. Take with meals.   fluticasone propionate 50 mcg/spray nasal spray Commonly known as: FLONASE Administer 1 spray into each nostril 2 (two) times a day.   gabapentin 100 mg capsule Commonly known as: NEURONTIN Take 200 mg by mouth 2 (two) times a day.   hydroCHLOROthiazide  12.5 mg tablet Commonly known as: HYDRODIURIL  Take 12.5 mg by mouth daily.   isosorbide mononitrate 30 mg 24 hr tablet Commonly known as: IMDUR Take 1 tablet by mouth daily.   multivitamin Tab tablet Commonly known as: STRESS FORMULA Take 1 tablet by mouth daily.   nitroglycerin  0.4 mg SL tablet Commonly known as: NITROSTAT  Take 0.4 mg by mouth as needed for chest pain.   omega 3-dha-epa-fish oil 1,000 mg DR capsule Commonly known as: OMEGA 3 Take 1 capsule by mouth daily.   semaglutide 1 mg/dose (4 mg/3 mL) Pnij 1 mL by abdominal subcutaneous route every 14 (fourteen) days.   Synthroid  200 mcg tablet Generic drug: levothyroxine  Take 1 tablet by mouth daily.         Follow-up Appointments Appointments which have been scheduled for you    Dec 19, 2022 2:30 PM Office Visit with Lennart Lauraine Kallman, MD [facility] POINT) [address] [place] [address]  [phone]  Please arrive 15 minutes prior to your scheduled visit.          Electronically signed by: Luetta Mulch Priddy, PA-C 07/12/2022 7:30 AM  Time spent on discharge: 50

## 2023-08-20 NOTE — ED Provider Notes (Signed)
 ------------------------------------------------------------------------------- Attestation signed by Therisa KANDICE Silvan, MD at 08/20/23 [phone] I attest that I am the attending provider for this patient and have reviewed the medical decision-making and plan as part of ongoing quality measures.  I was physically present in the department and available for consultation.  This case was not discussed with me during the patient's time in the ED.  -------------------------------------------------------------------------------  Patient presents to the ED for evaluation of chest pain and shortness of breath.  Patient states that he was at work earlier today around 8:00 AM when he began having chest pain and shortness of breath.  He localizes chest pain to his left midsternal region.  Denies any radiation into his upper extremities, neck, back region.  He describes his chest pain as pressure.  He states that his pain has improved and rates it a 2 out of 10.  He states that he is on Plavix  and aspirin .  He states he has not taken them yet.  He states that he previously had 2 stents placed back in 2016.  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  He denies any fevers, chills, nausea, vomiting, diarrhea, abdominal pain, diaphoresis. A medical screening exam was initiated virtually. Appropriate diagnostic tests were ordered if clinically indicated. Patient is under nursing supervision until moved to a treatment area of the emergency department  for completion of the emergency department evaluation\  [facility]  ED Provider Note  Frank Nguyen 58 y.o. male DOB: 08-Jan-1966 MRN: [id number] History   Chief Complaint  Patient presents with  . Chest Pain    C/o central CP described as tightness and burning x 3 hours. Hx of cardiac stent x 2.   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  Patient reports that he was at work, not doing anything in particular, when he started experiencing substernal chest pain that he describes as a pressure.  Patient reports the pain has been constant, worse with exertion.  Reports associated shortness of breath.  States that he took an aspirin  with no relief of symptoms.  States that he was seen back in 2016 for an MI, had a stent placed then at [facility].  Patient reports that he saw his cardiologist last year where he had a left heart cath and had 2 stents placed at that time.  Reports that he is on daily aspirin , Plavix .  Patient denies any history of blood clots.  States that he does feel smoke half a pack a day, is trying to quit.  Patient denies any dizziness, nausea, vomiting, abdominal pain.        Past Medical History:  Diagnosis Date  . Back pain   . Heart attack (*)   . High cholesterol   . Hip pain   . Hypertension   . Osteoarthritis   . Prediabetes     Past Surgical History:  Procedure Laterality Date  . Coronary angioplasty with stent placement    . Thyroid surgery      Social History   Substance and Sexual Activity  Alcohol Use No   Social History   Tobacco Use  Smoking Status Every Day  Smokeless Tobacco Never  Tobacco Comments   5 ciagrettes a day   E-Cigarettes  .  Vaping Use    . Start Date    . Cartridges/Day    . Quit Date     Social History   Substance and Sexual Activity  Drug Use No   Tetanus up to date?: Yes Immunizations Up to Date?: Yes   No Known Allergies  Home Medications   ASPIRIN  (ASPRI-LOW,ECOTRIN LOW DOSE) 81 MG EC TABLET    Take 81 mg by mouth daily.   CARVEDILOL  (COREG ) 12.5 MG TABLET    Take 12.5 mg by mouth 2 (two) times daily with meals.   HYDROCHLOROTHIAZIDE  (HYDRODIURIL ) 12.5 MG TABLET    Take by mouth  daily.   IBUPROFEN  (ADVIL ,MOTRIN ) 800 MG TABLET    Take one tablet (800 mg dose) by mouth 3 (three) times daily with meals.   NITROGLYCERIN  (NITROSTAT ) 0.4 MG SL TABLET    Place one tablet (0.4 mg dose) under the tongue every 5 (five) minutes as needed for Chest pain.    Primary Survey  Primary Survey  Review of Systems   Review of Systems  Constitutional:  Negative for fever.  Respiratory:  Positive for shortness of breath.   Cardiovascular:  Positive for chest pain.  Gastrointestinal:  Negative for abdominal distention, nausea and vomiting.  Neurological:  Negative for dizziness and syncope.    Physical Exam   ED Triage Vitals [08/20/23 1058]  BP 143/88  Heart Rate 56  Resp 18  SpO2 98 %  Temp 98.5 F (36.9 C)    Physical Exam  Constitutional: He appears well-developed and well-nourished.  HENT:  Head: Normocephalic and atraumatic.  Right Ear: Normal external ear.  Left Ear: Normal external ear.  Eyes: EOM are intact.  Neck: Normal range of motion.  Cardiovascular: Normal rate and regular rhythm.  Pulmonary/Chest: Respiratory effort normal and breath sounds normal.  Abdominal: Soft. There is no abdominal tenderness. There is no guarding and no rebound. Bowel sounds are normal.  Musculoskeletal: Normal range of motion.     Cervical back: Normal range of motion.   Neurological: He is alert and oriented to person, place, and time. Gait normal. He has normal speech.  Skin: Skin is warm. Skin is dry.  Psychiatric: He has a normal mood and affect. His behavior is normal.     ED Course   Lab results:   CBC AND DIFFERENTIAL - Abnormal      Result Value   WBC 6.2     RBC 4.87     HGB 13.0 (*)    HCT 39.8 (*)    MCV 81.7     MCH 26.7     MCHC 32.7     Plt Ct 294     RDW SD 46.5 (*)    MPV 9.1 (*)    NRBC% 0.0     Absolute NRBC Count 0.00     NEUTROPHIL % 60.6     LYMPHOCYTE % 30.3     MONOCYTE % 6.5     Eosinophil % 1.9     BASOPHIL % 0.5     IG% 0.2      ABSOLUTE NEUTROPHIL COUNT 3.75     ABSOLUTE LYMPHOCYTE COUNT 1.87     Absolute Monocyte Count 0.40     Absolute Eosinophil Count 0.12     Absolute Basophil Count 0.03     Absolute Immature Granulocyte Count 0.01    COMPREHENSIVE METABOLIC PANEL - Abnormal   Na 140     Potassium 4.5     Cl 105  CO2 27     AGAP 8     Glucose 112 (*)    BUN 12     Creatinine 0.98     Ca 9.6     ALK PHOS 190 (*)    T Bili 0.3     Total Protein 7.3     Alb 4.0     GLOBULIN 3.3     ALBUMIN/GLOBULIN RATIO 1.2     BUN/CREAT RATIO 12.2     ALT 27     AST 20     eGFR 89     Comment: Normal GFR (glomerular filtration rate) > 60 mL/min/1.73 meters squared, < 60 may include impaired kidney function. Calculation based on the Chronic Kidney Disease Epidemiology Collaboration (CK-EPI)equation refit without adjustment for race.  GEN5 CARDIAC TROPONIN T (TNT5) BASELINE - Normal   TnT-Gen5 (0hr) 11     Comment: An elevated Troponin indicates myocardial damage. Elevated troponin may also be due to pulmonary emboli, aortic dissection, heart failure, trauma, toxins and ischemia in the setting of critical illness.   MAGNESIUM - Normal   Mg 2.1    GEN5 CARDIAC TROPONIN T(TNT5) 1 HOUR - Normal   TnT-Gen5 (1hr) 11     Comment: An elevated Troponin indicates myocardial damage. Elevated troponin may also be due to pulmonary emboli, aortic dissection, heart failure, trauma, toxins and ischemia in the setting of critical illness.   Delta 1 Hour 0    GEN5 CARDIAC TROPONIN T(TNT5) 3 HOUR - Normal   TnT-Gen5 (3hr) 11     Comment: An elevated Troponin indicates myocardial damage. Elevated troponin may also be due to pulmonary emboli, aortic dissection, heart failure, trauma, toxins and ischemia in the setting of critical illness.   Delta 3 Hour 0    LIGHT BLUE TOP  GOLD SST    Imaging:   XR CHEST AP PORTABLE   Narrative:    XR CHEST AP PORTABLE 08/20/2023 11:28 AM  INDICATION:Chest Pain  COMPARISON: August 19, 2022  FINDINGS:   Lungs are adequately expanded. No consolidation, effusion, edema, or pneumothorax. Mediastinum and cardiac silhouette normal.    Impression:    IMPRESSION: No acute cardiopulmonary process.  Electronically Signed by: Lamar Berber IV on 08/20/2023 12:50 PM  CT ANGIO PULMONARY   Narrative:    INDICATION: Chest Pain.  COMPARISON:  None.  TECHNIQUE:  CT ANGIO PULMONARY was performed during IV administration of contrast. Contrast: 90 mL IOPAMIDOL 76 % IV SOLN   Image post processing was performed creating multi planar 2D and angiographic 3D MIP, shaded surface rendering or 3D volume rendering images for review and comparison. Radiation dose reduction was utilized (automated exposure control, mA or kV adjustment based on patient size, or iterative image reconstruction).  Contrast bolus quality:  satisfactory  FINDINGS:  LOWER NECK AND AXILLAE: Thyroid gland appears be surgically absent. Lower neck and axillae are benign. Some lateral pectoral nodes are seen on Significantly enlarged. Focal indeterminate skin lesion, series 8 image 146 anterior lower shoulder region.  PULMONARY ARTERIES: No pulmonary emboli are identified. RV/LV ratio: <0.9 Interventricular septum: No bowing IVC/hepatic vein reflux: None  LUNGS/PLEURA: No focal airspace opacities/consolidations. No pneumothorax.  No abnormal pulmonary masses.  No pleural effusions.  HEART/MEDIASTINUM: Cardiac size is normal.  Heavy coronary atherosclerosis. No acute thoracic aortic abnormalities. Ascending aorta is not enlarged. No hilar, mediastinal, or axillary lymphadenopathy.  MUSCULOSKELETAL: No acute or destructive osseous processes. Large confluent osteophytes in the lower thoracic spine.SABRA  MISC: Upper abdomen appears  benign.    Impression:    IMPRESSION: 1.  No pulmonary embolism. 2.  No acute findings.  3.  Heavy coronary atherosclerosis. 4.  Likely benign nonspecific abnormality in the  skin at the right anterior shoulder.        Electronically Signed by: Franky Cheshire MD, PHD on 08/20/2023 5:12 PM      ECG: ECG Results          ECG 12 lead (Final result)  Result time 08/20/23 15:55:53    Final result             Narrative:   Diagnosis Class Abnormal Acquisition Device D3K Systolic BP 130 Diastolic BP 61 Ventricular Rate 58 Atrial Rate 58 P-R Interval 170 QRS Duration 78 Q-T Interval 414 QTC Calculation(Bazett) 406 Calculated P Axis 71 Calculated R Axis 25 Calculated T Axis 46  Diagnosis Sinus bradycardia Cannot rule out Anterior infarct (cited on or before 19-Aug-2022) Abnormal ECG When compared with ECG of 20-Aug-2023 12:15, Non-specific change in ST segment in Inferior leads Normal axis Brien Kung ([id number]) on 08/20/2023 3:55:46 PM certifies that he/she has reviewed the ECG tracing and confirms the independent  interpretation is correct.                         ECG 12 lead (Final result)  Result time 08/20/23 12:55:18    Final result             Narrative:   Diagnosis Class Abnormal Acquisition Device D3K Ventricular Rate 57 Atrial Rate 57 P-R Interval 160 QRS Duration 76 Q-T Interval 388 QTC Calculation(Bazett) 377 Calculated P Axis 15 Calculated R Axis 4 Calculated T Axis 20  Diagnosis Sinus bradycardia Cannot rule out Anterior infarct (cited on or before 19-Aug-2022) Abnormal ECG When compared with ECG of 20-Aug-2023 10:53, fusion complexes are no longer present premature ventricular complexes are no longer present No acute injury pattern Brien Kung ([id number]) on 08/20/2023 12:55:11 PM certifies that he/she has reviewed the ECG tracing and confirms the independent  interpretation is correct.                         ECG 12 lead (Final result)  Result time 08/20/23 12:52:19    Final result             Narrative:   Diagnosis Class Abnormal Acquisition Device D3K Ventricular Rate 56 Atrial  Rate 56 P-R Interval 150 QRS Duration 76 Q-T Interval 394 QTC Calculation(Bazett) 380 Calculated P Axis 61 Calculated R Axis -4 Calculated T Axis 37  Diagnosis Sinus bradycardia with premature ventricular complexes or fusion complexes Cannot rule out Anterior infarct (cited on or before 19-Aug-2022) Abnormal ECG When compared with ECG of 19-Aug-2022 11:04, fusion complexes are now present premature ventricular complexes are now present T wave inversion no longer evident in Inferior leads No acute injury pattern Brien Kung ([id number]) on 08/20/2023 12:52:04 PM certifies that he/she has reviewed the ECG tracing and confirms the independent  interpretation is correct.                      HEAR Score History: Mostly high risk features ECG: Non specific repolarisation disturbance Age: 24-64 yrs Risk Factors: More than or equal 3 risk factors or history of atherosclerotic disease HEAR Score Total: 6  Pre-Sedation Procedures    Medical Decision Making  Based on HPI and physical exam, the following diagnostics were ordered:     Orders Placed This Encounter     XR Chest Ap Portable     CT Angio Pulmonary     Gen5 Cardiac Troponin T (TnT5) Baseline Series at: baseline, 1 hour, and 3 hour; Onset of symptoms: Greater than or equal 3 hours     ED Tube Set / Rainbow     CBC And Differential     Comprehensive Metabolic Panel     Magnesium     Gen5 Cardiac Troponin T (TnT5) 1H     Gen5 Cardiac Troponin T (TnT5) 3H     CBC revealed mild anemia.  Serial troponin normal and stable.  CMP unremarkable.  Magnesium normal. chest x-ray unremarkable.    CT angio pulmonary revealed  No pulmonary embolism. 2. No acute findings. 3. Heavy coronary atherosclerosis. 4. Likely benign nonspecific abnormality in the skin at the right anterior shoulder.   Vital signs stable, patient placed on cardiac monitor and  has remained in sinus rhythm throughout the entirety of his emergency department stay.  Discussed lab and imaging results with patient.  Patient given aspirin , nitroglycerin  in the emergency department with some relief of pain.  Ambulated patient in emergency department with exacerbation of chest pain.  Patient still experiencing chest pain after period of rest after exertion.  Consulted cardiology, Dr. Jacqualyn recommended admission and cardiology consult due to patient's significant history of CAD, previous stent, previous MI.  Will plan to admit patient.  Consult to hospitalist for admission, Dr. Sabina accepts admission.  Discussed plan for admission with patient, patient understands and is agreeable plan for admission.  No further questions at this time.  Amount and/or Complexity of Data Reviewed Radiology: ordered.  Risk Prescription drug management. Decision regarding hospitalization.           Provider Communication  New Prescriptions   No medications on file    Modified Medications   No medications on file    Discontinued Medications   ERYTHROMYCIN (ILOTYCIN) OPHTHALMIC OINTMENT    Place a 1/2 inch ribbon of ointment into the lower eyelid.    Clinical Impression Final diagnoses:  Chest pain, unspecified type    ED Disposition     ED Disposition  Admit   Condition  --   Comment  --                   Electronically signed by:    Frank JONELLE Perfect, PA-C 08/20/23 1756

## 2023-08-22 NOTE — Discharge Summary (Signed)
 [facility]  [facility] Discharge Summary  PCP: Refugia Lofts (Inactive) Discharge Details   Admit date:         08/20/2023 Discharge date:        08/22/2023  Hospital Days:    2 days  Code Status:   Full Code Advanced Directives on file: No Directive        Discharge Diagnoses:  Active Problems:   Coronary artery disease involving native coronary artery of native heart   Task list for follow-up: Please follow-up with your outpatient cardiologist Please follow-up with your PCP in 1 to 2 weeks   Follow-Up Appointments Suggested: Ambulatory referral to Sleep Studies     Refugia Lofts [place] [address] [phone]     Follow-Up Appointments Already Scheduled: No future appointments.  Discharge Medications: Current Discharge Medication List     DISCONTINUED medications     hydrochlorothiazide  (HYDRODIURIL ) 12.5 mg tablet      ibuprofen  (ADVIL ,MOTRIN ) 800 mg tablet        NEW medications   Details  empagliflozin (JARDIANCE) 10 mg TABS tablet Take one tablet (10 mg dose) by mouth daily. Start date: 08/22/2023    isosorbide mononitrate (IMDUR) 30 mg 24 hr tablet Take one tablet (30 mg dose) by mouth daily. Start date: 08/23/2023    ranolazine (RANEXA) 500 mg 12 hr tablet Take one tablet (500 mg dose) by mouth 2 (two) times daily. Start date: 08/22/2023       CONTINUED medications   Details  acetaminophen  (TYLENOL ) 325 mg tablet Take two tablets (650 mg dose) by mouth daily.    aspirin  (ASPRI-LOW,ECOTRIN LOW DOSE) 81 mg EC tablet Take one tablet (81 mg dose) by mouth daily.    atorvastatin  (LIPITOR ) 80 mg tablet Take one tablet (80 mg dose) by mouth every evening. for cholesterol    carvedilol  (COREG ) 12.5 mg tablet Take one tablet (12.5 mg dose) by mouth 2 (two) times daily with meals.    clopidogrel  bisulfate (PLAVIX ) 75 mg tablet Take one tablet (75 mg dose) by mouth daily.    levothyroxine   (SYNTHROID ,LEVOTHROID,LEVOXYL ) 200 MCG tablet Take one tablet (200 mcg dose) by mouth 1 (one) time each day before breakfast.    nitroGLYCERIN  (NITROSTAT ) 0.4 mg SL tablet Place one tablet (0.4 mg dose) under the tongue every 5 (five) minutes as needed for Chest pain.    tamsulosin  (FLOMAX ) 0.4 mg CAPS Take one capsule (0.4 mg dose) by mouth daily.        Allergies: No Known Allergies  Consultations this Admission: IP CONSULT TO CARDIOLOGY  Procedures/Imaging: Procedure(s) (LRB): Heart Cath-Left Diagnostic (N/A)    Cardiac Catheterization  Final Result  Dist RCA lesion is 40% stenosed.  .  Ramus lesion is 50% stenosed.  .  Prox LAD to Mid LAD lesion is 35% stenosed.  .  1st Diag lesion is 70% stenosed.  .  Mid RCA lesion is 35% stenosed.    DATE OF PROCEDURE: 08/21/2023    PRE-PROCEDURE DIAGNOSIS: Chronic intermittent chest discomfort    POST-PROCEDURE DIAGNOSIS: Same, multivessel nonobstructive coronary artery   disease with branch vessel obstructive coronary disease of stented jailed   diagonal branch of LAD    PRIMARY/CONSULTING/REFERRING CARDIOLOGIST: Madeleine Kendall MD     HISTORY  He has a history of known coronary artery disease.  It has history of   diabetes, hyperlipidemia, hypertension, chronic tobacco usage.  In 2016 he   presented to [facility] with acute anterior STEMI.  He underwent   angioplasty and stenting of the proximal to mid LAD.  Apparently there was   possible jailing of a small diagonal branch at that time.    He presented to [facility] in May 2024.  Underwent diagnostic   heart catheterization.  Complete details not available.  He was found to   in-stent restenosis within the stent in the mid LAD.  Is also found to   have jailed diagonal branch.  He underwent angioplasty of in-stent   restenosis.  Also underwent stenting of the LAD with overlap of the   previous stents.  Apparently had abrupt closure of the diagonal branch    requiring angioplasty and stenting through the previous stents in the mid   LAD.    At the time of his initial intervention in 2016 he reportedly had a 3.0 x   38 mm Synergy drug-eluting stent placed to the proximal to mid LAD.  In   2024 he underwent stenting of the mid LAD with a 3.5 x 12 mm Xience DES   with 2 mm overlap  with the previous LAD stent.  Subsequently required   angioplasty and stenting of the ostial proximal diagonal branch with a   2.25 x 18 mm DES.    He indicates that he has been having chronic intermittent chest discomfort   ever since his intervention in May 2024.  Presented to the hospital for   evaluation of evaluation of his chronic intermittent chest discomfort.    He had serial troponins that were normal.  High sensitive troponins were   11, 11, 11.  EKG revealed sinus rhythm with no evidence of ischemia.  He   was referred for cardiac catheterization.    PROCEDURES PERFORMED:   Left heart catheterization  Coronary angiography  Proximal aortography    VASCULAR ACCESS:    Right radial artery.   Right common femoral artery.     EQUIPMENT:  7184F Tiger 4.0 Catheter  7184FJudkins Right 4.0 Catheter  7184F Judkins Left 4.0 Catheter  7184F Pigtail Catheter    ESTIMATED BLOOD LOSS: <30 cc    TISSUE SAMPLES OBTAINED: None    ANESTHESIA: Under my direct supervision, 4 mg of versed  and 200 mcg of   Fentanyl  were administered intravenously for moderate sedation.  Pulse   oximetry, heart rate, and BP were continuously monitored by an independent   trained observer who was present throughout the case.  The procedure in   room time 15: 06, start/ sedation time 15: 21 and the end time 17: 30.    MEDICATION:  Verapamil  5 mg intra-arterially right radial artery   Heparin  2500 U IVP    PROCEDURAL DETAILS:   The patient was brought to the cardiac catheterization laboratory in a   fasting state.  Prepped and draped in standard aseptic technique.  Right   radial access  was obtained utilizing vascular ultrasound guided   cannulation.  The microcatheter was utilized to access the right radial   artery and a 6 French glide sheath was placed via the modified Seldinger   technique.   Selective coronary angiography was performed.  Angiography of   the LAD was suboptimal.  A JL 4, JR4 5, AL-1 were utilized but still   suboptimal opacification of the mid to distal LAD.  Attempts at   visualizing the right coronary artery were unsuccessful due to the   anterior superior takeoff of the origin of the right coronary artery.  Multiple catheters were tried including a JR4, AR-1, AL1, hockey-stick,   multipurpose A1 catheter.    Subsequently switched to right common femoral artery approach.  A 6 French   sheath was placed via the modified similar technique on a single stick   without difficulty.  Angiography revealed good access location.    Angiography of the LAD was repeated utilizing an AL-1 catheter.  Improved   visualization angiography was noted.    Multiple catheters were utilized to attempt to access the right coronary   artery.  Unable to visualize the location of the ostium of the right   coronary artery adequately.  A pigtail catheter was used to perform   proximal aortography.  Once the ostium was identified multiple catheters   were attempted.  Did retry the AR-1 without without success.  Tried an IMA   catheter without engagement.   We utilized a hockey-stick which did get   close to the ostium but unsuccessful engagement.  We tried the AL-1 and   finally was able to successfully engage the ostium of the right coronary   artery with successful angiography.    COMPLICATIONS: None    CLOSURE:   TR Band right radial-successful hemostasis   Manual hemostasis for right common femoral    FINDINGS:    Hemodynamics:    1. Central Aortic Pressure -146/80 mmHg. Mean 104 mmHg    Coronary Angiography:    Antatomically right dominant  Diffuse  fluoroscopic coronary calcification   Stents noted in the proximal to mid LAD and ostial proximal diagonal   branch.    Left Main: No angiographic stenosis  Left anterior descending artery: Mild diffuse luminal regularities.    Stents noted in the proximal to mid LAD.  30 to 40% in-stent restenosis   within the mid stented segment.  Mild to moderate irregularities mid to   distally.  No focal  obstructive stenosis.  Diagonal branches: Single major diagonal branch which is a small vessel.    There is a stent noted in the ostial proximal segment.  Difficult to   visualize the ostial segment of the diagonal branch.  There was haziness.    There was 50 to 70% ostial stenosis.  Difficult to determine if this was T   stented but was reported to TAP technique with small protrusion of the   diagonal stent into LAD.  Mid to distal diagonal branch was small.  Ramus intermedius/first marginal branch: 50% stenosis in the ostial   proximal segment  Left Circumflex:  Anatomically nondominant.  Mild diffuse luminal   irregularities.  No focal angiographic or obstructive stenosis.  Obtuse Marginal branches: 3 marginal branches off of the mid to distal   circumflex.  These branch vessels demonstrate mild irregularities with no   focal angiographic obstructive stenosis.  Right Coronary Artery:  Anatomically Dominant.  Arises anteriorly and   superiorly in the right coronary artery cusp mild diffuse luminal   irregularities.  30 to 40% mid vessel stenosis.  40% mid to distal   stenosis after the acute margin.  Mild irregularities distally.  Posterior Descending Artery: Small vessel.  Mild diffuse luminal   irregularities.        Left Ventriculography: Not performed      Aortography  Proximal aortogram of the aortic root and ascending aorta revealed normal   appearing aorta without evidence of significant atherosclerosis.  The   ostium of the right coronary artery appears to arise anteriorly and  superiorly in the right coronary cusp.      CONCLUSIONS:   Chronic intermittent chest discomfort ever since his most recent stenting   of the LAD and diagonal branch in May 2024.  Admitted with chest pain   syndrome thought to be unstable angina but does have normal troponins and   nonischemic EKG.  He does have at least moderate in-stent restenosis   within the ostial segment of small diagonal branch of the LAD.  There is   TIMI-3 flow in this branch.  The LAD does not have obstructive in-stent   restenosis.  Circumflex and right coronary artery are nonobstructive.  Known coronary artery disease with previous myocardial infarction in 2016.    Previous stenting of the LAD x 2 with overlap in the midsegment possibly   overlapping with the diagonal branch.  He is undergone subsequent stenting   of the diagonal branch with variant of T stenting (TAP).  Multiple cardiac   risk factors including hypertension, hyperlipidemia, diabetes, smoking.    RECOMMENDATIONS:   At the present time we will continue medical management.  Will plan to   optimize medical therapies.  May be reasonable to consider Cardiolite   testing on medical management to determine presence of ischemia and   ischemic burden in the lateral wall distribution of the diagonal branch.   - He is informed that although it is uncertain that percutaneous   intervention of the diagonal branch may be challenging if there is in fact   overlapping stents over the ostium of the diagonal branch of the diagonal   branch has been previously T stented which has a higher risk of ostial   restenosis.  If Cardiolite testing on medical management does demonstrate   significant ischemic burden, attempted percutaneous coronary invention may   be warranted.      The patient was informed of the findings at the conclusion of the   procedure. The patient's wife focuses was also informed of the results of   the procedure immediately following the  procedure in the post procedure   recovery area.     Electronically signed:  Norleen CHRISTELLA Solar, MD  08/21/2023 / 5:58 PM  [facility]    \  The imaging is on file and stored in a permanent location.      Echocardiogram Complete W Enhancing Agent  Final Result  Left Ventricle: Systolic function is normal. EF: 55-60%.  .  Left Ventricle: Left ventricle is mildly dilated.  .  IVC/SVC: The inferior vena cava demonstrates a diameter of >2.1 cm and   collapses <50%; therefore, the right atrial pressure is estimated at 15   mmHg.      CT Angio Pulmonary  Final Result  IMPRESSION:  1.  No pulmonary embolism.  2.  No acute findings.   3.  Heavy coronary atherosclerosis.  4.  Likely benign nonspecific abnormality in the skin at the right anterior shoulder.                Electronically Signed by: Franky Cheshire MD, PHD on 08/20/2023 5:12 PM    XR Chest Ap Portable  Final Result  IMPRESSION: No acute cardiopulmonary process.    Electronically Signed by: Lamar Berber IV on 08/20/2023 12:50 PM      Pertinent Labs:  Cardiac Labs: No results for input(s): CK, CKMB, CTNI, BNP in the last 168 hours. CBC: Recent Labs    Units 08/22/23 0559 08/21/23 0244 08/20/23 1121  WBC thou/mcL  4.9 7.2 6.2  HGB gm/dL 87.3* 87.1* 86.9*  PLT thou/mcL 250 270 294   BMP: Recent Labs    Units 08/22/23 0559 08/21/23 0244 08/20/23 1121  NA mmol/L 135* 137 140  K mmol/L 4.2 3.9 4.5  CL mmol/L 102 103 105  CO2 mmol/L 24 25 27   BUN mg/dL 9 15 12   CREATININE mg/dL 9.10 9.00 9.01  MAGNESIUM mg/dL 2.0  --  2.1   Lipid Panel: Recent Labs    Units 08/20/23 2312  CHOL mg/dL 890  TRIG mg/dL 868  HDL mg/dL 34*  LDL mg/dL 49   Liver Enzymes: Recent Labs    Units 08/20/23 1121  AST U/L 20  ALT U/L 27  ALKPHOS U/L 190*  BILITOT mg/dL 0.3   Endocrine Panels: Recent Labs    Units 08/22/23 1138 08/22/23 0840 08/22/23 0559 08/21/23 2053 08/21/23 1851  08/21/23 1447 08/21/23 1408 08/21/23 0244 08/20/23 1121  TSH mcIU/mL  --   --   --   --   --   --   --  0.51  --   HGBA1C %  --   --   --   --   --   --   --  7.3*  --   GLUCOSE mg/dL 751* 838* 883* 834* 892* 92 78 121* 112[facility] Course   Physicians involved in care during this hospitalization Attending Provider: Maybelle Pelton, MD Admitting Provider: Maybelle Pelton, MD Consulting Physician: Augusto ONEIDA Crawford Mickey., MD  HPI per admitting provider: 58 year old with past medical history relevant for coronary artery disease status post stent x 2, hypertension who presents with exertional chest pain.  Patient began to have exertional chest pain at 8 AM.  Patient was at work.  Describes associated diaphoresis with it as well.  Feels similar to prior angina.  Pain was reported to be midsternal lesion.  In the ER vitals were unremarkable.  Labs are notable for normal troponins.  EKG was not consistent with ACS.  Patient was given aspirin . In terms of his cardiac history he initially had a stent placed at [facility] in 2016 at the mid LAD.  He subsequently had mid stent stenosis and recalcitrant angina and so at [facility] Course:    No notes on file #) Chest pain/CAD: Patient was admitted with chest pain that was typical for his angina.  Troponins were negative.  EKG was nonischemic.  Patient had cardiac catheterization on 08/21/2023 that showed no target lesions for further stent.  Patient was determined to be a good candidate for medical management and was started on ranolazine.  He should follow-up with his outpatient cardiologist for further evaluation and management of his coronary artery disease.  He was continued on home aspirin  81 mg, clopidogrel  75 mg, high intensity statin, beta-blocker.  Lipids were acceptable  #) Type 2 diabetes: Patient was diagnosed with type 2 diabetes during this hospitalization during medical management of his coronary artery disease.  He was started on an SGLT2  inhibitor as he has had significant side effects with metformin.  #) Hypertension/hyperlipidemia: Patient was continued on carvedilol  12.5 mg twice daily, atorvastatin  80 mg.  #) Hypothyroidism: Patient was continued on levothyroxine  200 mcg daily.  TSH was normal.  #) BPH: Patient was continued on home tamsulosin   BP 126/66 (BP Location: Right Upper Arm, Patient Position: Lying)   Pulse 62   Temp 99.1 F (37.3 C) (Oral)   Resp 17   Ht 1.854 m (6' 0.99)  Wt (!) 142.3 kg (313 lb 11.4 oz)   SpO2 97%   BMI 41.40 kg/m   Physical Exam Vitals reviewed.  Constitutional:      General: He is not in acute distress.    Appearance: Normal appearance.  HENT:     Head: Normocephalic and atraumatic.     Right Ear: External ear normal.     Left Ear: External ear normal.     Nose: Nose normal.     Mouth/Throat:     Mouth: Mucous membranes are moist.  Eyes:     General: No scleral icterus.    Conjunctiva/sclera: Conjunctivae normal.  Cardiovascular:     Rate and Rhythm: Normal rate and regular rhythm.     Pulses: Normal pulses.     Heart sounds: No murmur heard. Pulmonary:     Effort: Pulmonary effort is normal. No respiratory distress.     Breath sounds: Normal breath sounds. No wheezing.  Abdominal:     General: Bowel sounds are normal. There is no distension.     Palpations: Abdomen is soft.     Tenderness: There is no abdominal tenderness.  Musculoskeletal:        General: No swelling. Normal range of motion.     Cervical back: Neck supple.  Skin:    General: Skin is warm.     Capillary Refill: Capillary refill takes 2 to 3 seconds.     Coloration: Skin is not jaundiced.  Neurological:     General: No focal deficit present.     Mental Status: He is alert.     Post Hospital Care   Activity:   Weight Bearing Status:          Oxygen Orders for Discharge: O2 Device: None (Room air) SpO2: 97 %  Diet: Diet and Nourishment Orders (From admission, onward)      Start       08/21/23 1847  Cardiac diet Carb Consistent  Diet effective now       Question:  Additional restrictions:  Answer:  Carb Consistent              Wound Care Recommendations: None   Lines/Drains/Airways: Patient Lines/Drains/Airways Status     Active LDAs     Name Placement date Placement time Site Days   Peripheral IV 20 G Anterior;Right Forearm 08/20/23  1405  Forearm  2   Peripheral IV 20 G Left;Posterior Hand 08/21/23  1240  Hand  1            Therapy Recommendations:  PT:                  OT:            SLP:              Home Health Orders: DME Orders (From admission, onward)    None      Home Health Agency     None       I spent 75 minutes performing discharge services.   Electronically signed: Maybelle Pelton, MD 08/22/2023 / 2:34 PM

## 2023-09-05 ENCOUNTER — Encounter (HOSPITAL_COMMUNITY): Payer: Self-pay | Admitting: Emergency Medicine

## 2023-09-05 ENCOUNTER — Emergency Department (HOSPITAL_COMMUNITY)
Admission: EM | Admit: 2023-09-05 | Discharge: 2023-09-05 | Disposition: A | Discharge status: Home / Self Care | Attending: Emergency Medicine | Admitting: Emergency Medicine

## 2023-09-05 ENCOUNTER — Emergency Department (HOSPITAL_COMMUNITY)

## 2023-09-05 ENCOUNTER — Other Ambulatory Visit: Payer: Self-pay

## 2023-09-05 DIAGNOSIS — R110 Nausea: Secondary | ICD-10-CM | POA: Insufficient documentation

## 2023-09-05 DIAGNOSIS — E039 Hypothyroidism, unspecified: Secondary | ICD-10-CM | POA: Diagnosis not present

## 2023-09-05 DIAGNOSIS — I10 Essential (primary) hypertension: Secondary | ICD-10-CM | POA: Insufficient documentation

## 2023-09-05 DIAGNOSIS — Z79890 Hormone replacement therapy (postmenopausal): Secondary | ICD-10-CM | POA: Diagnosis not present

## 2023-09-05 DIAGNOSIS — R42 Dizziness and giddiness: Secondary | ICD-10-CM | POA: Insufficient documentation

## 2023-09-05 DIAGNOSIS — K30 Functional dyspepsia: Secondary | ICD-10-CM | POA: Insufficient documentation

## 2023-09-05 DIAGNOSIS — Z7902 Long term (current) use of antithrombotics/antiplatelets: Secondary | ICD-10-CM | POA: Insufficient documentation

## 2023-09-05 DIAGNOSIS — Z7982 Long term (current) use of aspirin: Secondary | ICD-10-CM | POA: Insufficient documentation

## 2023-09-05 DIAGNOSIS — R0789 Other chest pain: Secondary | ICD-10-CM | POA: Insufficient documentation

## 2023-09-05 DIAGNOSIS — Z79899 Other long term (current) drug therapy: Secondary | ICD-10-CM | POA: Insufficient documentation

## 2023-09-05 DIAGNOSIS — R079 Chest pain, unspecified: Secondary | ICD-10-CM

## 2023-09-05 DIAGNOSIS — I2510 Atherosclerotic heart disease of native coronary artery without angina pectoris: Secondary | ICD-10-CM | POA: Insufficient documentation

## 2023-09-05 DIAGNOSIS — Z720 Tobacco use: Secondary | ICD-10-CM | POA: Insufficient documentation

## 2023-09-05 LAB — TROPONIN I (HIGH SENSITIVITY)
Troponin I (High Sensitivity): 3 ng/L (ref <18)
Troponin I (High Sensitivity): 3 ng/L (ref <18)

## 2023-09-05 LAB — BASIC METABOLIC PANEL WITH GFR
Anion gap: 10 (ref 5–15)
BUN: 10 mg/dL (ref 6–20)
CO2: 22 mmol/L (ref 22–32)
Calcium: 9 mg/dL (ref 8.9–10.3)
Chloride: 106 mmol/L (ref 98–111)
Creatinine, Ser: 1.07 mg/dL (ref 0.61–1.24)
GFR, Estimated: >60 mL/min (ref >60)
Glucose, Bld: 132 mg/dL — ABNORMAL HIGH (ref 70–99)
Potassium: 4.1 mmol/L (ref 3.5–5.1)
Sodium: 138 mmol/L (ref 135–145)

## 2023-09-05 LAB — CBC
HCT: 41.5 % (ref 39.0–52.0)
Hemoglobin: 13.4 g/dL (ref 13.0–17.0)
MCH: 26.4 pg (ref 26.0–34.0)
MCHC: 32.3 g/dL (ref 30.0–36.0)
MCV: 81.9 fL (ref 80.0–100.0)
Platelets: 319 10*3/uL (ref 150–400)
RBC: 5.07 MIL/uL (ref 4.22–5.81)
RDW: 15.8 % — ABNORMAL HIGH (ref 11.5–15.5)
WBC: 6.3 10*3/uL (ref 4.0–10.5)
nRBC: 0 % (ref 0.0–0.2)

## 2023-09-05 MED ORDER — PANTOPRAZOLE SODIUM 40 MG IV SOLR
40.0000 mg | Freq: Once | INTRAVENOUS | Status: AC
  Administered 2023-09-05: 40 mg via INTRAVENOUS
  Filled 2023-09-05: qty 10

## 2023-09-05 NOTE — Discharge Instructions (Addendum)
 Thank you for coming to [facility] Emergency Department. You were seen for chest pain.. We did an exam, labs, and imaging, and these showed no acute findings. You were evaluated by cardiology who suggested doubling your Imdur dose from 30 mg to 60 mg daily.   Please follow up with your cardiologist in clinic within 1 week.   Do not hesitate to return to the ED or call 911 if you experience: -Worsening symptoms -Lightheadedness, passing out -Fevers/chills -Anything else that concerns you

## 2023-09-05 NOTE — Consult Note (Signed)
 Cardiology Consultation   Patient ID: [name] MRN: [id number]; DOB: 1965/09/15  Admit date: 09/05/2023 Date of Consult: 09/05/2023  PCP:  Clinic, [facility] Providers Cardiologist:  None        Patient Profile:   Frank Nguyen is a 58 y.o. male with a hx of CAD who is being seen 09/05/2023 for the evaluation of chest pain at the request of the ED.  History of Present Illness:   [name] complains of chest discomfort/tightness in the mid chest radiating to the L shoulder that is somewhat chronic in nature.  He is on a robust antianginal regimen including isosorbide, ranolazine, carvedilol .  He has a history of a LAD stent that jailed a small diagonal branch in 2016, then developed instent restenosis and had a second layer of stent in 09/2022 that further compromised flow to that jailed diagonal branch.  Of note, he recently underwent cath at [facility] on 08/21/23 for unstable angina - troponins negative. Procedure details below. Short story is that no new disease was identified, the mid LAD stents are patent, and the jailed diagonal branch is still small with TIMI 3 flow.  Medical management was recommended.  Today his ECG shows no ischemic changes, and serial troponins are again negative.   Past Medical History:  Diagnosis Date   HLD (hyperlipidemia)    HTN (hypertension)    Hypothyroid    Sleep apnea 2009   on CPAP   Tobacco abuse    Tumor, thyroid 2010   No records seen, only self-reported     Past Surgical History:  Procedure Laterality Date   CARDIAC CATHETERIZATION N/A 09/29/2014   Procedure: Left Heart Cath and Coronary Angiography;  Surgeon: Lucendia Rusk, MD;  Location: [facility] INVASIVE CV LAB;  Service: Cardiovascular;  Laterality: N/A;   THYROIDECTOMY  2010   at [facility] in [place]   TONSILLECTOMY AND ADENOIDECTOMY         Inpatient Medications: Scheduled Meds:  Continuous Infusions:  PRN Meds:   Allergies:   No  Known Allergies  Social History:   Social History   Socioeconomic History   Marital status: Married    Spouse name: Not on file   Number of children: 1   Years of education: Not on file   Highest education level: Not on file  Occupational History   Occupation: Manual Labor - [employer]  Tobacco Use   Smoking status: Some Days    Current packs/day: 0.25    Average packs/day: 0.3 packs/day for 20.0 years (5.0 ttl pk-yrs)    Types: Cigarettes   Smokeless tobacco: Not on file  Substance and Sexual Activity   Alcohol use: No   Drug use: No   Sexual activity: Not on file  Other Topics Concern   Not on file  Social History Narrative   Mr. Bilderback was recently released from prison in October 2012. He now lives with his wife and 60 year old daughter.       Works for [employer] in [place].    Social Drivers of Corporate investment banker Strain: Not on file  Food Insecurity: No Food Insecurity (08/20/2023)   Received from [facility]   Hunger Vital Sign    Worried About Running Out of Food in the Last Year: Never true    Ran Out of Food in the Last Year: Never true  Transportation Needs: No Transportation Needs (08/20/2023)   Received from [facility]   PRAPARE - Administrator, Civil Service (Medical): No    Lack of Transportation (Non-Medical): No  Physical Activity: Not on file  Stress: No Stress Concern Present (08/20/2023)   Received from [facility] - Occupational Stress Questionnaire    Feeling of Stress : Not at all  Social Connections: Unknown (09/22/2021)   Received from [facility]   Social Network    Social Network: Not on file  Intimate Partner Violence: Not At Risk (08/20/2023)   Received from [facility]   HITS    Over the last 12 months how often did your partner physically hurt you?: Never    Over the last 12 months how often did your partner insult you or talk down to you?:  Never    Over the last 12 months how often did your partner threaten you with physical harm?: Never    Over the last 12 months how often did your partner scream or curse at you?: Never    Family History:    Family History  Problem Relation Age of Onset   Diabetes Mother    Diabetes Father    Cancer Sister        Breast   Diabetes Brother    Alzheimer's disease Other        grandparents, aunts, uncles    Heart failure Mother      ROS:  Please see the history of present illness.   All other ROS reviewed and negative.     Physical Exam/Data:   Vitals:   09/05/23 1630 09/05/23 1645 09/05/23 1700 09/05/23 1800  BP: 122/60 122/65 139/82 129/73  Pulse: (!) 57 (!) 57 (!) 52 (!) 42  Resp: (!) 22 (!) 22 (!) 23 (!) 24  Temp:      TempSrc:      SpO2: 100% 100% 100% 100%  Weight:      Height:       No intake or output data in the 24 hours ending 09/05/23 2009    09/05/2023    3:08 PM 09/18/2022    9:42 PM 02/08/2015    9:13 AM  Last 3 Weights  Weight (lbs) 315 lb 318 lb 300 lb  Weight (kg) 142.883 kg 144.244 kg 136.079 kg     Body mass index is 41.56 kg/m.  General:  Well nourished, well developed, in no acute distress HEENT: normal Neck: no JVD Vascular: No carotid bruits; Distal pulses 2+ bilaterally Cardiac:  normal S1, S2; RRR; no murmur  Lungs:  clear to auscultation bilaterally, no wheezing, rhonchi or rales  Abd: soft, nontender, no hepatomegaly  Ext: no edema Musculoskeletal:  No deformities, BUE and BLE strength normal and equal Skin: warm and dry  Neuro:  CNs 2-12 intact, no focal abnormalities noted Psych:  Normal affect   EKG:  The EKG was personally reviewed and demonstrates:  no ischemic changes Telemetry:  Telemetry was personally reviewed and demonstrates:  sinus rhythm  Relevant CV Studies: Cath on 08/21/23: The patient was brought to the cardiac catheterization laboratory in a fasting state.  Prepped and draped in standard aseptic technique.   Right radial access was obtained utilizing vascular ultrasound guided cannulation.  The microcatheter was utilized to access the right radial artery and a 6 French glide sheath was placed via the modified Seldinger technique.   Selective coronary angiography was performed.  Angiography of the LAD was suboptimal.  A JL 4, JR4 5,  AL-1 were utilized but still suboptimal opacification of the mid to distal LAD.  Attempts at visualizing the right coronary artery were unsuccessful due to the anterior superior takeoff of the origin of the right coronary artery.   Multiple catheters were tried including a JR4, AR-1, AL1, hockey-stick, multipurpose A1 catheter.  Subsequently switched to right common femoral artery approach.  A 6 French sheath was placed via the modified similar technique on a single stick without difficulty.  Angiography revealed good access location.   Angiography of the LAD was repeated utilizing an AL-1 catheter.  Improved visualization angiography was noted.  Multiple catheters were utilized to attempt to access the right coronary artery.  Unable to visualize the location of the ostium of the right coronary artery adequately.  A pigtail catheter was used to perform proximal aortography.  Once the ostium was identified multiple catheters were attempted.  Did retry the AR-1 without without success.  Tried an IMA catheter without engagement.   We utilized a hockey-stick which did get close to the ostium but unsuccessful engagement.  We tried the AL-1 and finally was able to successfully engage the ostium of the right coronary artery with successful angiography.  COMPLICATIONS: None  Coronary Angiography:  Antatomically right dominant Diffuse fluoroscopic coronary calcification Stents noted in the proximal to mid LAD and ostial proximal diagonal branch.  Left Main: No angiographic stenosis Left anterior descending artery: Mild diffuse luminal regularities.   Stents noted  in the proximal to mid LAD.  30 to 40% in-stent restenosis within the mid stented segment.  Mild to moderate irregularities mid to distally.  No focal  obstructive stenosis. Diagonal branches: Single major diagonal branch which is a small vessel.   There is a stent noted in the ostial proximal segment.  Difficult to visualize the ostial segment of the diagonal branch.  There was haziness.   There was 50 to 70% ostial stenosis.  Difficult to determine if this was T stented but was reported to TAP technique with small protrusion of the diagonal stent into LAD.  Mid to distal diagonal branch was small. Ramus intermedius/first marginal branch: 50% stenosis in the ostial proximal segment Left Circumflex:  Anatomically nondominant.  Mild diffuse luminal irregularities.  No focal angiographic or obstructive stenosis. Obtuse Marginal branches: 3 marginal branches off of the mid to distal circumflex.  These branch vessels demonstrate mild irregularities with no focal angiographic obstructive stenosis. Right Coronary Artery:  Anatomically Dominant.  Arises anteriorly and superiorly in the right coronary artery cusp mild diffuse luminal irregularities.  30 to 40% mid vessel stenosis.  40% mid to distal stenosis after the acute margin.  Mild irregularities distally. Posterior Descending Artery: Small vessel.  Mild diffuse luminal irregularities.  CONCLUSIONS: Chronic intermittent chest discomfort ever since his most recent stenting of the LAD and diagonal branch in May 2024.  Admitted with chest pain syndrome thought to be unstable angina but does have normal troponins and nonischemic EKG.  He does have at least moderate in-stent restenosis within the ostial segment of small diagonal branch of the LAD.  There is TIMI-3 flow in this branch.  The LAD does not have obstructive in-stent restenosis.  Circumflex and right coronary artery are nonobstructive. Known coronary artery disease with previous  myocardial infarction in 2016.  Previous stenting of the LAD x 2 with overlap in the midsegment possibly overlapping with the diagonal branch.  He is undergone subsequent stenting of the diagonal branch with variant of T stenting (TAP).  Multiple cardiac  risk factors including hypertension, hyperlipidemia, diabetes, smoking.  RECOMMENDATIONS: At the present time we will continue medical management.  Will plan to optimize medical therapies.  May be reasonable to consider Cardiolite testing on medical management to determine presence of ischemia and ischemic burden in the lateral wall distribution of the diagonal branch. - He is informed that although it is uncertain that percutaneous intervention of the diagonal branch may be challenging if there is in fact overlapping stents over the ostium of the diagonal branch of the diagonal branch has been previously T stented which has a higher risk of ostial restenosis.  If Cardiolite testing on medical management does demonstrate significant ischemic burden, attempted percutaneous coronary invention may be warranted.   Laboratory Data:  High Sensitivity Troponin:   Recent Labs  Lab 09/05/23 1518 09/05/23 1757  TROPONINIHS 3 3     Chemistry Recent Labs  Lab 09/05/23 1518  NA 138  K 4.1  CL 106  CO2 22  GLUCOSE 132*  BUN 10  CREATININE 1.07  CALCIUM  9.0  GFRNONAA >60  ANIONGAP 10     Hematology Recent Labs  Lab 09/05/23 1518  WBC 6.3  RBC 5.07  HGB 13.4  HCT 41.5  MCV 81.9  MCH 26.4  MCHC 32.3  RDW 15.8*  PLT 319   Thyroid No results for input(s): "TSH", "FREET4" in the last 168 hours.  BNPNo results for input(s): "BNP", "PROBNP" in the last 168 hours.  DDimer No results for input(s): "DDIMER" in the last 168 hours.   Radiology/Studies:  DG Chest 2 View Result Date: 09/05/2023 CLINICAL DATA:  Chest pain for multiple days. EXAM: CHEST - 2 VIEW COMPARISON:  None Available. FINDINGS: The heart size and mediastinal  contours are within normal limits. Both lungs are clear. The visualized skeletal structures are unremarkable. IMPRESSION: No active cardiopulmonary disease. Electronically Signed   By: Jone Neither M.D.   On: 09/05/2023 17:24     Assessment and Plan:  Chronic chest discomfort with recent cath showing no flow obstruction.  He is on a very good antianginal regimen.  BP is well controlled.  I recommend increasing the imdur from 30 to 60 mg, but to otherwise continue his ranolazine and beta-blocker.  He already has follow-up planned with his primary cardiologist likely for later this week to review his cath results.  At present, no indication for repeat cath or admission for ACS.   Risk Assessment/Risk Scores:            [facility] will sign off.   Medication Recommendations:  increase imdur to 60 daily Other recommendations (labs, testing, etc):  continue other meds Follow up as an outpatient:  routine follow-up with primary cardiologist.  For questions or updates, please contact [facility] Please consult [email or web address] for contact info under    Signed, Philmore Bream, MD  09/05/2023 8:09 PM

## 2023-09-05 NOTE — ED Triage Notes (Signed)
 Pt arrives in wheelchair by POV c/o chest pain. Has been going on for a few days. Pt had heart cath 4/10 in [facility]. Pt had stents placed a year or so ago. Chest pain does not radiate anywhere. Pt has some nausea, indigestion, and dizziness. Pt has nitroglycerin  but did not take any.

## 2023-09-05 NOTE — ED Provider Notes (Signed)
 Frank Nguyen Provider Note   CSN: [id number] Arrival date & time: 09/05/23  1452     History {Add pertinent medical, surgical, social history, OB history to HPI:1} Chief Complaint  Patient presents with   Chest Pain    Frank Nguyen is a 58 y.o. male with PMH as listed below who arrives in wheelchair by POV c/o chest pain. Has been going on for a few days. Pt had heart cath 4/10 in [facility]. Pt had stents placed a year or so ago. Chest pain does not radiate anywhere. Pt has some nausea, indigestion, and dizziness. Pt has nitroglycerin  but did not take any.    Per chart review was admitted at [facility] from 08/20/2023 to 08/22/2023 for chronic intermittent chest discomfort thought to be unstable angina.  Had cardiac cath on 08/21/2023 which demonstrated multivessel nonobstructive CAD with moderate in-stent restenosis of branch of LAD status post subsequent stenting.  Serial troponins were normal.  Past Medical History:  Diagnosis Date   HLD (hyperlipidemia)    HTN (hypertension)    Hypothyroid    Sleep apnea 2009   on CPAP   Tobacco abuse    Tumor, thyroid 2010   No records seen, only self-reported        Home Medications Prior to Admission medications   Medication Sig Start Date End Date Taking? Authorizing Provider  acetaminophen  (TYLENOL ) 500 MG tablet Take 500 mg by mouth every 6 (six) hours as needed.    [provider]  aspirin  81 MG chewable tablet Chew 1 tablet (81 mg total) by mouth daily. 09/30/14   Mikhail, Maryann, DO  atorvastatin  (LIPITOR ) 80 MG tablet Take 1 tablet (80 mg total) by mouth daily at 6 PM. 09/30/14   Mikhail, Maryann, DO  carvedilol  (COREG ) 3.125 MG tablet Take 1 tablet (3.125 mg total) by mouth 2 (two) times daily with a meal. 09/30/14   Abron Abt, DO  clopidogrel  (PLAVIX ) 75 MG tablet Take 1 tablet (75 mg total) by mouth daily with breakfast. 09/30/14   Mikhail, Maryann, DO  diazepam   (VALIUM ) 5 MG tablet Take 1 tablet (5 mg total) by mouth 2 (two) times daily. 07/26/20   Haviland, Julie, MD  hydrochlorothiazide  (HYDRODIURIL ) 25 MG tablet Take 25 mg by mouth daily.    [provider]  HYDROcodone -acetaminophen  (NORCO/VICODIN) 5-325 MG tablet Take 1 tablet by mouth every 4 (four) hours as needed. 07/26/20   Haviland, Julie, MD  levothyroxine  (SYNTHROID , LEVOTHROID) 200 MCG tablet Take 1 tablet (200 mcg total) by mouth daily. 11/30/13 11/30/14  McKeag, Tommie Frame, MD  levothyroxine  (SYNTHROID , LEVOTHROID) 200 MCG tablet Take 200 mcg by mouth daily before breakfast.    [provider]  nitroGLYCERIN  (NITROSTAT ) 0.4 MG SL tablet Place 1 tablet (0.4 mg total) under the tongue every 5 (five) minutes as needed for chest pain. 09/30/14   Mikhail, Maryann, DO  ondansetron  (ZOFRAN -ODT) 4 MG disintegrating tablet Take 1 tablet (4 mg total) by mouth every 8 (eight) hours as needed for nausea. 09/19/22   Coretha Dew, PA-C  oxyCODONE -acetaminophen  (PERCOCET) 5-325 MG tablet Take 1 tablet by mouth every 4 (four) hours as needed. 09/19/22   Coretha Dew, PA-C  predniSONE  (STERAPRED UNI-PAK 21 TAB) 10 MG (21) TBPK tablet Take 6 tabs for 2 days, then 5 for 2 days, then 4 for 2 days, then 3 for 2 days, 2 for 2 days, then 1 for 2 days 07/26/20   Sueellen Emery, MD  tamsulosin  (FLOMAX ) 0.4 MG CAPS capsule Take 1 capsule (0.4 mg total) by mouth daily after supper. 09/19/22   Coretha Dew, PA-C      Allergies    Patient has no known allergies.    Review of Systems   Review of Systems A 10 point review of systems was performed and is negative unless otherwise reported in HPI.  Physical Exam Updated Vital Signs BP 135/87 (BP Location: Left Arm)   Pulse (!) 58   Temp 98.6 F (37 C) (Oral)   Ht 6\' 1"  (1.854 m)   Wt (!) 142.9 kg   SpO2 99%   BMI 41.56 kg/m  Physical Exam General: Normal appearing {Desc; male/male:11659}, lying in bed.  HEENT: PERRLA, Sclera anicteric, MMM,  trachea midline.  Cardiology: RRR, no murmurs/rubs/gallops. BL radial and DP pulses equal bilaterally.  Resp: Normal respiratory rate and effort. CTAB, no wheezes, rhonchi, crackles.  Abd: Soft, non-tender, non-distended. No rebound tenderness or guarding.  GU: Deferred. MSK: No peripheral edema or signs of trauma. Extremities without deformity or TTP. No cyanosis or clubbing. Skin: warm, dry. No rashes or lesions. Back: No CVA tenderness Neuro: A&Ox4, CNs II-XII grossly intact. MAEs. Sensation grossly intact.  Psych: Normal mood and affect.   ED Results / Procedures / Treatments   Labs (all labs ordered are listed, but only abnormal results are displayed) Labs Reviewed  CBC - Abnormal; Notable for the following components:      Result Value   RDW 15.8 (*)    All other components within normal limits  BASIC METABOLIC PANEL WITH GFR  TROPONIN I (HIGH SENSITIVITY)    EKG None  Radiology No results found.  Procedures Procedures  {Document cardiac monitor, telemetry assessment procedure when appropriate:1}  Medications Ordered in ED Medications - No data to display  ED Course/ Medical Decision Making/ A&P                          Medical Decision Making Amount and/or Complexity of Data Reviewed Labs: ordered. Decision-making details documented in ED Course. Radiology: ordered.  Risk Prescription drug management.    This patient presents to the ED for concern of ***, this involves an extensive number of treatment options, and is a complaint that carries with it a high risk of complications and morbidity.  I considered the following differential and admission for this acute, potentially life threatening condition.   MDM:    ***  Clinical Course as of 09/05/23 2057  Sat Sep 05, 2023  1711 Troponin I (High Sensitivity): 3 neg [HN]    Clinical Course User Index [HN] Frank Stalling, MD    Labs: I Ordered, and personally interpreted labs.  The pertinent results  include:  ***  Imaging Studies ordered: I ordered imaging studies including *** I independently visualized and interpreted imaging. I agree with the radiologist interpretation  Additional history obtained from ***.  External records from outside source obtained and reviewed including ***  Cardiac Monitoring: The patient was maintained on a cardiac monitor.  I personally viewed and interpreted the cardiac monitored which showed an underlying rhythm of: ***  Reevaluation: After the interventions noted above, I reevaluated the patient and found that they have :{resolved/improved/worsened:23923::"improved"}  Social Determinants of Health: ***  Disposition:  ***  Co morbidities that complicate the patient evaluation  Past Medical History:  Diagnosis Date   HLD (hyperlipidemia)    HTN (hypertension)    Hypothyroid  Sleep apnea 2009   on CPAP   Tobacco abuse    Tumor, thyroid 2010   No records seen, only self-reported      Medicines No orders of the defined types were placed in this encounter.   I have reviewed the patients home medicines and have made adjustments as needed  Problem List / ED Course: Problem List Items Addressed This Visit   None        {Document critical care time when appropriate:1} {Document review of labs and clinical decision tools ie heart score, Chads2Vasc2 etc:1}  {Document your independent review of radiology images, and any outside records:1} {Document your discussion with family members, caretakers, and with consultants:1} {Document social determinants of health affecting pt's care:1} {Document your decision making why or why not admission, treatments were needed:1}  This note was created using dictation software, which may contain spelling or grammatical errors.

## 2023-09-05 NOTE — ED Notes (Signed)
 Patient dc'ed home at this time no acute distress a/o no complaints offered. Vss reviewed dc instructions/follow up answered all questions verbalized understanding encouraged to return if s/s persit or become bothersome iv dc'ed patient ambulates steady at this time.

## 2023-11-28 ENCOUNTER — Emergency Department (HOSPITAL_COMMUNITY)

## 2023-11-28 ENCOUNTER — Emergency Department (HOSPITAL_COMMUNITY)
Admission: EM | Admit: 2023-11-28 | Discharge: 2023-11-28 | Disposition: A | Discharge status: Home / Self Care | Attending: Emergency Medicine | Admitting: Emergency Medicine

## 2023-11-28 ENCOUNTER — Encounter (HOSPITAL_COMMUNITY): Payer: Self-pay

## 2023-11-28 ENCOUNTER — Other Ambulatory Visit: Payer: Self-pay

## 2023-11-28 DIAGNOSIS — M25562 Pain in left knee: Secondary | ICD-10-CM | POA: Insufficient documentation

## 2023-11-28 DIAGNOSIS — Z7982 Long term (current) use of aspirin: Secondary | ICD-10-CM | POA: Insufficient documentation

## 2023-11-28 DIAGNOSIS — Z79899 Other long term (current) drug therapy: Secondary | ICD-10-CM | POA: Diagnosis not present

## 2023-11-28 MED ORDER — PREDNISONE 10 MG PO TABS: ORAL_TABLET | ORAL | 0 refills | Status: AC

## 2023-11-28 MED ORDER — OXYCODONE-ACETAMINOPHEN 5-325 MG PO TABS: 1.0000 | ORAL_TABLET | ORAL | 0 refills | Status: AC | PRN

## 2023-11-28 NOTE — ED Provider Notes (Signed)
 [place] EMERGENCY DEPARTMENT AT [facility] Provider Note   CSN: [id number] Arrival date & time: 11/28/23  0944     Patient presents with: Knee Pain   Frank Nguyen is a 58 y.o. male.   Pt complains of left knee pain.  Pt reports pain began having pain in his left knee on Thursday.  Pt denies any injury.  Pt reports limping due to pain.  Pt reports unable to sleep last pm due to pain   The history is provided by the patient. No language interpreter was used.  Knee Pain Location:  Knee Time since incident:  4 days Injury: no   Knee location:  R knee Pain details:    Radiates to:  Does not radiate   Severity:  Severe   Duration:  3 days Chronicity:  New Foreign body present:  No foreign bodies      Prior to Admission medications   Medication Sig Start Date End Date Taking? Authorizing Provider  acetaminophen  (TYLENOL ) 500 MG tablet Take 500 mg by mouth every 6 (six) hours as needed.    [provider]  aspirin  81 MG chewable tablet Chew 1 tablet (81 mg total) by mouth daily. 09/30/14   Mikhail, Maryann, DO  atorvastatin  (LIPITOR ) 80 MG tablet Take 1 tablet (80 mg total) by mouth daily at 6 PM. 09/30/14   Mikhail, Maryann, DO  carvedilol  (COREG ) 3.125 MG tablet Take 1 tablet (3.125 mg total) by mouth 2 (two) times daily with a meal. 09/30/14   Jeannett Liming, DO  clopidogrel  (PLAVIX ) 75 MG tablet Take 1 tablet (75 mg total) by mouth daily with breakfast. 09/30/14   Mikhail, Maryann, DO  diazepam  (VALIUM ) 5 MG tablet Take 1 tablet (5 mg total) by mouth 2 (two) times daily. 07/26/20   Haviland, Julie, MD  hydrochlorothiazide  (HYDRODIURIL ) 25 MG tablet Take 25 mg by mouth daily.    [provider]  HYDROcodone -acetaminophen  (NORCO/VICODIN) 5-325 MG tablet Take 1 tablet by mouth every 4 (four) hours as needed. 07/26/20   Haviland, Julie, MD  levothyroxine  (SYNTHROID , LEVOTHROID) 200 MCG tablet Take 1 tablet (200 mcg total) by mouth daily. 11/30/13  11/30/14  McKeag, Chauncey BIRCH, MD  levothyroxine  (SYNTHROID , LEVOTHROID) 200 MCG tablet Take 200 mcg by mouth daily before breakfast.    [provider]  nitroGLYCERIN  (NITROSTAT ) 0.4 MG SL tablet Place 1 tablet (0.4 mg total) under the tongue every 5 (five) minutes as needed for chest pain. 09/30/14   Mikhail, Maryann, DO  ondansetron  (ZOFRAN -ODT) 4 MG disintegrating tablet Take 1 tablet (4 mg total) by mouth every 8 (eight) hours as needed for nausea. 09/19/22   Jarold Olam HERO, PA-C  oxyCODONE -acetaminophen  (PERCOCET) 5-325 MG tablet Take 1 tablet by mouth every 4 (four) hours as needed. 09/19/22   Jarold Olam HERO, PA-C  predniSONE  (STERAPRED UNI-PAK 21 TAB) 10 MG (21) TBPK tablet Take 6 tabs for 2 days, then 5 for 2 days, then 4 for 2 days, then 3 for 2 days, 2 for 2 days, then 1 for 2 days 07/26/20   Dean Clarity, MD  tamsulosin  (FLOMAX ) 0.4 MG CAPS capsule Take 1 capsule (0.4 mg total) by mouth daily after supper. 09/19/22   Jarold Olam HERO, PA-C    Allergies: Patient has no known allergies.    Review of Systems  All other systems reviewed and are negative.   Updated Vital Signs BP 132/81 (BP Location: Right Arm)   Pulse 66   Temp 97.7 F (36.5  C) (Oral)   Resp 18   SpO2 97%   Physical Exam Vitals reviewed.  Cardiovascular:     Rate and Rhythm: Normal rate.  Musculoskeletal:        General: Swelling and tenderness present.     Comments: Tender pre patella area,  swelling knee joint companied to right,  no effusion, negative homans'.  Nv and ns intact, large varicosities both legs   Skin:    General: Skin is warm.  Neurological:     General: No focal deficit present.     Mental Status: He is alert.     (all labs ordered are listed, but only abnormal results are displayed) Labs Reviewed - No data to display  EKG: None  Radiology: DG Knee Complete 4 Views Left Result Date: 11/28/2023 CLINICAL DATA:  Left knee pain.  No known injury. EXAM: LEFT KNEE - COMPLETE 4+ VIEW  COMPARISON:  None Available. FINDINGS: No evidence of fracture, dislocation, or significant joint effusion. The joint spaces are preserved. Trace peripheral spurring. Suggestion of subcutaneous varicosities most prominent medially. Suggestion of subcutaneous edema. IMPRESSION: 1. Trace degenerative spurring. No acute osseous abnormality. 2. Suggestion of subcutaneous edema and varicosities. Electronically Signed   By: Andrea Gasman M.D.   On: 11/28/2023 10:53     Procedures   Medications Ordered in the ED - No data to display                                  Medical Decision Making Pt complains of pain in his left knee.  Pain is worse with walking.  Pt reports limping since Thursday   Amount and/or Complexity of Data Reviewed Radiology: ordered and independent interpretation performed. Decision-making details documented in ED Course.    Details: Xray  left knee, no fracture, trace spurring  Risk Prescription drug management. Risk Details: Patient advised to follow-up with orthopedist either here in [place] or at [facility].  He is given a prescription for oxycodone  and prednisone .  He is advised to wear brace.  Patient advised to return to the emergency department if symptoms worsen or change.        Final diagnoses:  Acute pain of left knee    ED Discharge Orders          Ordered    predniSONE  (DELTASONE ) 10 MG tablet       Note to Pharmacy: Please provide dose pack   11/28/23 1123    oxyCODONE -acetaminophen  (PERCOCET) 5-325 MG tablet  Every 4 hours PRN        11/28/23 1123            An After Visit Summary was printed and given to the patient.    Lashaunta Sicard K, PA-C 11/28/23 1124    Patsey Lot, MD 11/29/23 [phone]

## 2023-11-28 NOTE — Discharge Instructions (Addendum)
 [facility]ist here in [place] for further evaluation.  Take medications as directed.

## 2023-11-28 NOTE — ED Triage Notes (Signed)
 Pt c.o left knee pain since last Thursday, no specific injury. Pt has brace on knee at this time. Pt denies any other associated symptoms. Takes a blood thinner

## 2024-03-02 ENCOUNTER — Other Ambulatory Visit: Payer: Self-pay

## 2024-03-02 ENCOUNTER — Emergency Department (HOSPITAL_COMMUNITY)
Admission: EM | Admit: 2024-03-02 | Discharge: 2024-03-02 | Disposition: A | Discharge status: Home / Self Care | Attending: Emergency Medicine | Admitting: Emergency Medicine

## 2024-03-02 ENCOUNTER — Encounter (HOSPITAL_COMMUNITY): Payer: Self-pay | Admitting: Radiology

## 2024-03-02 DIAGNOSIS — E039 Hypothyroidism, unspecified: Secondary | ICD-10-CM | POA: Insufficient documentation

## 2024-03-02 DIAGNOSIS — M25512 Pain in left shoulder: Secondary | ICD-10-CM | POA: Diagnosis present

## 2024-03-02 DIAGNOSIS — Z7982 Long term (current) use of aspirin: Secondary | ICD-10-CM | POA: Insufficient documentation

## 2024-03-02 DIAGNOSIS — I10 Essential (primary) hypertension: Secondary | ICD-10-CM | POA: Diagnosis not present

## 2024-03-02 DIAGNOSIS — Z79899 Other long term (current) drug therapy: Secondary | ICD-10-CM | POA: Diagnosis not present

## 2024-03-02 MED ORDER — FENTANYL CITRATE (PF) 50 MCG/ML IJ SOSY
50.0000 ug | PREFILLED_SYRINGE | Freq: Once | INTRAMUSCULAR | Status: AC
  Administered 2024-03-02: 50 ug via INTRAMUSCULAR
  Filled 2024-03-02: qty 1

## 2024-03-02 MED ORDER — ONDANSETRON 4 MG PO TBDP
4.0000 mg | ORAL_TABLET | Freq: Once | ORAL | Status: AC
  Administered 2024-03-02: 4 mg via ORAL
  Filled 2024-03-02: qty 1

## 2024-03-02 MED ORDER — HYDROCODONE-ACETAMINOPHEN 5-325 MG PO TABS
1.0000 | ORAL_TABLET | Freq: Once | ORAL | Status: AC
  Administered 2024-03-02: 1 via ORAL
  Filled 2024-03-02: qty 1

## 2024-03-02 NOTE — Discharge Instructions (Addendum)
 It was a pleasure taking part in your care.  As discussed, I am referring you to orthopedics.  Please remain in the shoulder sling for comfort.  You may remove the shoulder sling to shower and bathe.  You may take ibuprofen  or Tylenol  at home for pain.  If this does not relieve pain, please proceed to oxycodone  pain medication.  Take 1 tablet every 6 hours.  Do not drive, operate machinery, mix with alcohol.  Please return to ED with new symptoms such as chest pain or shortness of breath.  Read attached guide concerning shoulder pain.

## 2024-03-02 NOTE — ED Triage Notes (Signed)
 Pt came in via POV d/t Lt shoulder injury that he was recommended to come in for MRI when he was seen at [facility], d/t a possible tear.

## 2024-03-02 NOTE — ED Triage Notes (Signed)
 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

## 2024-03-02 NOTE — Progress Notes (Signed)
 Orthopedic Tech Progress Note Patient Details:  Frank Nguyen [birth year] [id number]  Ortho Devices Type of Ortho Device: Shoulder immobilizer Ortho Device/Splint Location: L SHOULDER Ortho Device/Splint Interventions: Ordered, Application, Adjustment   Post Interventions Patient Tolerated: Well Instructions Provided: Care of device  [name] L [name] 03/02/2024, 11:03 PM

## 2024-03-02 NOTE — ED Provider Notes (Signed)
 [place] EMERGENCY DEPARTMENT AT [facility] Provider Note   CSN: [id number] Arrival date & time: 03/02/24  1735     Patient presents with: Shoulder Pain   Frank Nguyen is a 58 y.o. male with history of hypothyroid, hypertension, tobacco abuse, low back pain.  Patient's to ED for evaluation of left shoulder pain.  States that he has had left shoulder pain for the last 2 weeks.  Reports that a few days before 10/13 he suffered left shoulder injury as he was carrying drywall through a awkward house.  Reports that the hallways of this home were very narrow and he had to make awkward movements to adjust himself and carry the drywall.  He states that he developed left shoulder pain was seen in the urgent care setting on 10/13.  He had an unremarkable x-ray of his left shoulder taken, he was referred to orthopedics and he was prescribed a Medrol Dosepak, given Toradol shot and discharged.  He states that he has had improved pain since 10/13, he has been taking ibuprofen  and Tylenol  at home.  He states that in the last few days his shoulder pain is acutely worsened without any further trauma or injury.  He reports that he called the [facility] today and he spoke to a receptionist who told him to come to the ED for an MRI of his left shoulder.  He reports that he has not followed up with orthopedic provider he was referred to at urgent care visit.  He reports that they are out of network.  He denies any chest pain or shortness of breath.  He denies numbness or tingling of his left upper extremity.   Shoulder Pain      Prior to Admission medications   Medication Sig Start Date End Date Taking? Authorizing Provider  acetaminophen  (TYLENOL ) 500 MG tablet Take 500 mg by mouth every 6 (six) hours as needed.    [provider]  aspirin  81 MG chewable tablet Chew 1 tablet (81 mg total) by mouth daily. 09/30/14   Mikhail, Maryann, DO  atorvastatin  (LIPITOR ) 80 MG tablet Take 1 tablet (80 mg  total) by mouth daily at 6 PM. 09/30/14   Mikhail, Maryann, DO  carvedilol  (COREG ) 3.125 MG tablet Take 1 tablet (3.125 mg total) by mouth 2 (two) times daily with a meal. 09/30/14   Mikhail, Maryann, DO  clopidogrel  (PLAVIX ) 75 MG tablet Take 1 tablet (75 mg total) by mouth daily with breakfast. 09/30/14   Mikhail, Maryann, DO  diazepam  (VALIUM ) 5 MG tablet Take 1 tablet (5 mg total) by mouth 2 (two) times daily. 07/26/20   Dean Clarity, MD  hydrochlorothiazide  (HYDRODIURIL ) 25 MG tablet Take 25 mg by mouth daily.    [provider]  levothyroxine  (SYNTHROID , LEVOTHROID) 200 MCG tablet Take 1 tablet (200 mcg total) by mouth daily. 11/30/13 11/30/14  McKeag, Chauncey BIRCH, MD  levothyroxine  (SYNTHROID , LEVOTHROID) 200 MCG tablet Take 200 mcg by mouth daily before breakfast.    [provider]  nitroGLYCERIN  (NITROSTAT ) 0.4 MG SL tablet Place 1 tablet (0.4 mg total) under the tongue every 5 (five) minutes as needed for chest pain. 09/30/14   Mikhail, Maryann, DO  ondansetron  (ZOFRAN -ODT) 4 MG disintegrating tablet Take 1 tablet (4 mg total) by mouth every 8 (eight) hours as needed for nausea. 09/19/22   Jarold Olam HERO, PA-C  oxyCODONE -acetaminophen  (PERCOCET) 5-325 MG tablet Take 1 tablet by mouth every 4 (four) hours as needed for severe pain (pain score  7-10). 11/28/23 11/27/24  Flint Sonny POUR, PA-C  predniSONE  (DELTASONE ) 10 MG tablet 6,5,4,3,2,1 taper 11/28/23   Sofia, Leslie K, PA-C  tamsulosin  (FLOMAX ) 0.4 MG CAPS capsule Take 1 capsule (0.4 mg total) by mouth daily after supper. 09/19/22   Jarold Olam HERO, PA-C    Allergies: Patient has no known allergies.    Review of Systems  Respiratory:  Negative for shortness of breath.   Cardiovascular:  Negative for chest pain.  Musculoskeletal:  Positive for arthralgias.    Updated Vital Signs BP 138/82   Pulse (!) 56   Temp 98.6 F (37 C) (Oral)   Resp (!) 22   Ht 6' 1 (1.854 m)   Wt 106.6 kg   SpO2 100%   BMI 31.00 kg/m    Physical Exam Vitals and nursing note reviewed.  Constitutional:      General: He is not in acute distress.    Appearance: He is well-developed.  HENT:     Head: Normocephalic and atraumatic.  Eyes:     Conjunctiva/sclera: Conjunctivae normal.  Cardiovascular:     Rate and Rhythm: Normal rate and regular rhythm.     Heart sounds: No murmur heard. Pulmonary:     Effort: Pulmonary effort is normal. No respiratory distress.     Breath sounds: Normal breath sounds.  Abdominal:     Palpations: Abdomen is soft.     Tenderness: There is no abdominal tenderness.  Musculoskeletal:        General: No swelling.     Cervical back: Neck supple.     Comments: Reduced ROM left shoulder secondary to pain.  No deformity.  Positive empty can test.  Skin:    General: Skin is warm and dry.     Capillary Refill: Capillary refill takes less than 2 seconds.  Neurological:     Mental Status: He is alert.  Psychiatric:        Mood and Affect: Mood normal.     (all labs ordered are listed, but only abnormal results are displayed) Labs Reviewed - No data to display  EKG: None  Radiology: No results found.  Procedures   Medications Ordered in the ED  ondansetron  (ZOFRAN -ODT) disintegrating tablet 4 mg (4 mg Oral Given 03/02/24 1815)  HYDROcodone -acetaminophen  (NORCO/VICODIN) 5-325 MG per tablet 1 tablet (1 tablet Oral Given 03/02/24 1814)  fentaNYL  (SUBLIMAZE ) injection 50 mcg (50 mcg Intramuscular Given 03/02/24 2250)    Medical Decision Making Risk Prescription drug management.   58 year old male presents for evaluation of left shoulder injury suffered 2 weeks ago.  On exam, he is hemodynamically stable afebrile and nontachycardic.  Lung sounds clear bilaterally, no hypoxia.  Abdomen soft and compressible.  Neuroexam at baseline.  No edema to bilateral lower extremities.  Overall nontoxic in appearance.  Left shoulder without obvious injury, deformity.  Positive empty can test.  No  strength resistance.  Patient denies any chest pain.  He was offered a cardiac laboratory evaluation but defers stating that he feels this is purely related to his shoulder injury.  Patient x-ray was reviewed from urgent care visit and shows mild acromioclavicular and glenohumeral osteoarthritis.  Patient placed in shoulder sling, given 50 mcg fentanyl  for pain.  Patient was advised that we do not typically offer MRIs in the ED for orthopedic injuries suffered 2 weeks ago and he voiced understanding.  I advised the patient that he will follow-up with orthopedic provider which I referred him to.  Patient was given shoulder sling, pain  medication and discharged in stable condition.  He was advised to return to the ED with new symptoms such as chest pain or shortness of breath and he voiced understanding.  He was given a work note requesting light duty.  He is stable to discharge.   Final diagnoses:  Acute pain of left shoulder    ED Discharge Orders     None          Ruthell Lonni JULIANNA DEVONNA 03/02/24 2333    Midge Golas, MD 03/03/24 [phone]

## 2024-03-02 NOTE — ED Provider Triage Note (Signed)
 Emergency Medicine Provider Triage Evaluation Note  Frank Nguyen , a 59 y.o. male  was evaluated in triage.  Pt complains of left shoulder pain that has been ongoing now for about a week and a half.  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  He noticed the pain the day after.  Has worsened since then.  He went to the [facility] and they sent him to the emergency department.  Review of Systems  Positive: As above Negative: As above  Physical Exam  BP (!) 141/80   Pulse 77   Temp 98.6 F (37 C) (Oral)   Ht 6' 1 (1.854 m)   Wt 106.6 kg   SpO2 97%   BMI 31.00 kg/m  Gen:   Awake, no distress  Resp:  Normal effort  MSK:   Moves extremities without difficulty  Other:    Medical Decision Making  Medically screening exam initiated at 6:01 PM.  Appropriate orders placed.  Frank Nguyen was informed that the remainder of the evaluation will be completed by another provider, this initial triage assessment does not replace that evaluation, and the importance of remaining in the ED until their evaluation is complete.     Hildegard Loge, PA-C 03/02/24 [id number]

## 2024-03-03 MED ORDER — HYDROCODONE-ACETAMINOPHEN 5-325 MG PO TABS: 1.0000 | ORAL_TABLET | Freq: Four times a day (QID) | ORAL | 0 refills | Status: AC | PRN
